# Patient Record
Sex: Female | Born: 1975 | Race: White | Hispanic: No | Marital: Single | Smoking: Former smoker
Health system: Southern US, Community
[De-identification: ages and names within clinical notes are randomized; demographics above are authoritative.]

## PROBLEM LIST (undated history)

## (undated) DIAGNOSIS — R8789 Other abnormal findings in specimens from female genital organs: Secondary | ICD-10-CM

## (undated) DIAGNOSIS — J329 Chronic sinusitis, unspecified: Secondary | ICD-10-CM

## (undated) DIAGNOSIS — J302 Other seasonal allergic rhinitis: Secondary | ICD-10-CM

## (undated) HISTORY — DX: Other abnormal findings in specimens from female genital organs: R87.89

## (undated) HISTORY — PX: OTHER SURGICAL HISTORY: SHX169

## (undated) HISTORY — PX: LEEP: SHX91

## (undated) HISTORY — DX: Other seasonal allergic rhinitis: J30.2

## (undated) HISTORY — PX: ABCESS DRAINAGE: SHX399

## (undated) HISTORY — DX: Chronic sinusitis, unspecified: J32.9

---

## 2015-03-03 ENCOUNTER — Ambulatory Visit (INDEPENDENT_AMBULATORY_CARE_PROVIDER_SITE_OTHER): Payer: Managed Care, Other (non HMO) | Admitting: Family Medicine

## 2015-03-03 ENCOUNTER — Encounter: Payer: Self-pay | Admitting: Family Medicine

## 2015-03-03 VITALS — BP 116/72 | HR 60 | Ht 61.5 in | Wt 124.0 lb

## 2015-03-03 DIAGNOSIS — Z800 Family history of malignant neoplasm of digestive organs: Secondary | ICD-10-CM

## 2015-03-03 DIAGNOSIS — Z23 Encounter for immunization: Secondary | ICD-10-CM

## 2015-03-03 DIAGNOSIS — Z0000 Encounter for general adult medical examination without abnormal findings: Secondary | ICD-10-CM | POA: Diagnosis not present

## 2015-03-03 LAB — POCT URINALYSIS DIPSTICK
Bilirubin, UA: NEGATIVE
Glucose, UA: NEGATIVE
Ketones, UA: NEGATIVE
Leukocytes, UA: NEGATIVE
NITRITE UA: NEGATIVE
PROTEIN UA: NEGATIVE
SPEC GRAV UA: 1.025
UROBILINOGEN UA: NEGATIVE
pH, UA: 6

## 2015-03-03 LAB — LIPID PANEL
CHOL/HDL RATIO: 2.4 ratio (ref <=5.0)
Cholesterol: 232 mg/dL — ABNORMAL HIGH (ref 125–200)
HDL: 95 mg/dL (ref >=46)
LDL Cholesterol: 128 mg/dL (ref <130)
Triglycerides: 44 mg/dL (ref <150)
VLDL: 9 mg/dL (ref <30)

## 2015-03-03 LAB — CBC WITH DIFFERENTIAL/PLATELET
BASOS ABS: 0 10*3/uL (ref 0.0–0.1)
BASOS PCT: 1 % (ref 0–1)
EOS ABS: 0 10*3/uL (ref 0.0–0.7)
EOS PCT: 1 % (ref 0–5)
HCT: 42.2 % (ref 36.0–46.0)
Hemoglobin: 14.3 g/dL (ref 12.0–15.0)
LYMPHS ABS: 0.8 10*3/uL (ref 0.7–4.0)
Lymphocytes Relative: 21 % (ref 12–46)
MCH: 33.4 pg (ref 26.0–34.0)
MCHC: 33.9 g/dL (ref 30.0–36.0)
MCV: 98.6 fL (ref 78.0–100.0)
MPV: 10.9 fL (ref 8.6–12.4)
Monocytes Absolute: 0.4 10*3/uL (ref 0.1–1.0)
Monocytes Relative: 9 % (ref 3–12)
NEUTROS PCT: 68 % (ref 43–77)
Neutro Abs: 2.7 10*3/uL (ref 1.7–7.7)
PLATELETS: 200 10*3/uL (ref 150–400)
RBC: 4.28 MIL/uL (ref 3.87–5.11)
RDW: 12.3 % (ref 11.5–15.5)
WBC: 4 10*3/uL (ref 4.0–10.5)

## 2015-03-03 LAB — COMPREHENSIVE METABOLIC PANEL
ALK PHOS: 40 U/L (ref 33–115)
ALT: 18 U/L (ref 6–29)
AST: 20 U/L (ref 10–30)
Albumin: 4.6 g/dL (ref 3.6–5.1)
BUN: 17 mg/dL (ref 7–25)
CHLORIDE: 102 mmol/L (ref 98–110)
CO2: 28 mmol/L (ref 20–31)
CREATININE: 0.74 mg/dL (ref 0.50–1.10)
Calcium: 9.3 mg/dL (ref 8.6–10.2)
GLUCOSE: 72 mg/dL (ref 65–99)
POTASSIUM: 4.4 mmol/L (ref 3.5–5.3)
SODIUM: 140 mmol/L (ref 135–146)
Total Bilirubin: 0.8 mg/dL (ref 0.2–1.2)
Total Protein: 6.9 g/dL (ref 6.1–8.1)

## 2015-03-03 LAB — TSH: TSH: 1.104 u[IU]/mL (ref 0.350–4.500)

## 2015-03-03 NOTE — Progress Notes (Signed)
Subjective:    Patient ID: Joan Hernandez, female    DOB: [birth year], 39 y.o.   MRN: [id number]  HPI She is new to the practice and here for a complete physical exam. She has no complaints. She moved here from [place] in 2015. She has not seen a health care provider in years and does not recall ever having labs done.  Has IUD for birth control. Has not had a menstrual cycle since getting her first Mirena in 2008 and she is happy with this.   She has 2 kids age 45 and 68, boy and girl. She is happily divorced. Recently had permament tattoo eyeliner.  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  Denies smoking but did smoke some in her early 60s. Drinks alcohol socially and denies drug use.  She does not have her immunization records and would like to get a Tdap and flu shot today.   Her mother has colon cancer and is in her 19s.   Reviewed allergies, medications, past medical, surgical, social history, and family history.   Review of Systems Review of Systems Constitutional: -fever, -chills, -sweats, -unexpected weight change,-fatigue ENT: -runny nose, -ear pain, -sore throat Cardiology:  -chest pain, -palpitations, -edema Respiratory: -cough, -shortness of breath, -wheezing Gastroenterology: -abdominal pain, -nausea, -vomiting, -diarrhea, -constipation  Hematology: -bleeding or bruising problems Musculoskeletal: -arthralgias, -myalgias, -joint swelling, -back pain Ophthalmology: -vision changes Genitourinary: -dysuria, -difficulty urinating, -hematuria, -urinary frequency, -urgency, -vaginal discharge, -vaginal dryness Neurology: -headache, -weakness, -tingling, -numbness       Objective:   Physical Exam BP 116/72 mmHg  Pulse 60  Ht 5' 1.5" (1.562 m)  Wt 124 lb (56.246 kg)  BMI 23.05 kg/m2  General Appearance:    Alert, cooperative, no distress, appears stated age  Head:    Normocephalic, without obvious abnormality, atraumatic  Eyes:    PERRL, conjunctiva/corneas clear,  EOM's intact, fundi    benign  Ears:    Normal TM's and external ear canals  Nose:   Nares normal, mucosa normal, no drainage or sinus   tenderness  Throat:   Lips, mucosa, and tongue normal; teeth and gums normal  Neck:   Supple, no lymphadenopathy;  thyroid:  no   enlargement/tenderness/nodules  Back:    Spine nontender, no curvature, ROM normal, no CVA     tenderness  Lungs:     Clear to auscultation bilaterally without wheezes, rales or     ronchi; respirations unlabored  Chest Wall:    No tenderness or deformity   Heart:    Regular rate and rhythm, S1 and S2 normal, no murmur, rub   or gallop  Breast Exam:    No tenderness, masses, or nipple discharge or inversion.      No axillary lymphadenopathy  Abdomen:     Soft, non-tender, nondistended, normoactive bowel sounds,    no masses, no hepatosplenomegaly  Genitalia:    Normal external genitalia without lesions.  BUS and vagina normal; cervix without lesions, or cervical motion tenderness. No abnormal vaginal discharge.  Uterus and adnexa not enlarged, nontender, no masses.  Pap was not performed  Rectal:    Not performed due to age<40 and no related complaints  Extremities:   No clubbing, cyanosis or edema  Pulses:   2+ and symmetric all extremities  Skin:   Skin color, texture, turgor normal, no rashes or lesions  Lymph nodes:   Cervical, supraclavicular, and axillary nodes normal  Neurologic:   CNII-XII intact, normal strength, sensation and  gait; reflexes 2+ and symmetric throughout          Psych:   Normal mood, affect, hygiene and grooming.    Urinalysis dipstick shows 1+blood.     Assessment & Plan:  Routine general medical examination at a health care facility - Plan: CBC with Differential/Platelet, Comprehensive metabolic panel, Lipid panel, POCT urinalysis dipstick, TSH  Family history of colon cancer in mother  Need for prophylactic vaccination and inoculation against influenza - Plan: Flu Vaccine QUAD 36+ mos IM  Need  for Tdap vaccination - Plan: Tdap vaccine greater than or equal to 7yo IM  Congratulated her on taking good care of herself by exercising daily and eating a well balanced diet consisting of mainly fruits and vegetables. Will bring her back for a repeat UA in 2 weeks since her dipstick UA was positive for blood. She will be due for a Pap smear and HPV co-testing next year. Recommend that she get her first colonoscopy in the next year or 2 due to mother having colon cancer in her 12s. Overall she is taking good care of herself.

## 2015-03-03 NOTE — Patient Instructions (Addendum)
Continue eating healthy and exercising. You will be due for your Pap smear and HPV testing next year. Today you received your flu shot and Tdap. We will call you with your lab results. I recommend getting a colonoscopy in your early 32s due to your family history.  Preventative Care for Adults - Female      MAINTAIN REGULAR HEALTH EXAMS:  A routine yearly physical is a good way to check in with your primary care provider about your health and preventive screening. It is also an opportunity to share updates about your health and any concerns you have, and receive a thorough all-over exam.   Most health insurance companies pay for at least some preventative services.  Check with your health plan for specific coverages.  WHAT PREVENTATIVE SERVICES DO WOMEN NEED?  Adult women should have their weight and blood pressure checked regularly.   Women age 62 and older should have their cholesterol levels checked regularly.  Women should be screened for cervical cancer with a Pap smear and pelvic exam beginning at either age 39, or 3 years after they become sexually activity.    Breast cancer screening generally begins at age 39 with a mammogram and breast exam by your primary care provider.    Beginning at age 39 and continuing to age 54, women should be screened for colorectal cancer.  Certain people may need continued testing until age 60.  Updating vaccinations is part of preventative care.  Vaccinations help protect against diseases such as the flu.  Osteoporosis is a disease in which the bones lose minerals and strength as we age. Women ages 39 and over should discuss this with their caregivers, as should women after menopause who have other risk factors.  Lab tests are generally done as part of preventative care to screen for anemia and blood disorders, to screen for problems with the kidneys and liver, to screen for bladder problems, to check blood sugar, and to check your cholesterol  level.  Preventative services generally include counseling about diet, exercise, avoiding tobacco, drugs, excessive alcohol consumption, and sexually transmitted infections.    GENERAL RECOMMENDATIONS FOR GOOD HEALTH:  Healthy diet:  Eat a variety of foods, including fruit, vegetables, animal or vegetable protein, such as meat, fish, chicken, and eggs, or beans, lentils, tofu, and grains, such as rice.  Drink plenty of water daily.  Decrease saturated fat in the diet, avoid lots of red meat, processed foods, sweets, fast foods, and fried foods.  Exercise:  Aerobic exercise helps maintain good heart health. At least 30-40 minutes of moderate-intensity exercise is recommended. For example, a brisk walk that increases your heart rate and breathing. This should be done on most days of the week.   Find a type of exercise or a variety of exercises that you enjoy so that it becomes a part of your daily life.  Examples are running, walking, swimming, water aerobics, and biking.  For motivation and support, explore group exercise such as aerobic class, spin class, Zumba, Yoga,or  martial arts, etc.    Set exercise goals for yourself, such as a certain weight goal, walk or run in a race such as a 5k walk/run.  Speak to your primary care provider about exercise goals.  Disease prevention:  If you smoke or chew tobacco, find out from your caregiver how to quit. It can literally save your life, no matter how long you have been a tobacco user. If you do not use tobacco, never begin.  Maintain a healthy diet and normal weight. Increased weight leads to problems with blood pressure and diabetes.   The Body Mass Index or BMI is a way of measuring how much of your body is fat. Having a BMI above 27 increases the risk of heart disease, diabetes, hypertension, stroke and other problems related to obesity. Your caregiver can help determine your BMI and based on it develop an exercise and dietary program to  help you achieve or maintain this important measurement at a healthful level.  High blood pressure causes heart and blood vessel problems.  Persistent high blood pressure should be treated with medicine if weight loss and exercise do not work.   Fat and cholesterol leaves deposits in your arteries that can block them. This causes heart disease and vessel disease elsewhere in your body.  If your cholesterol is found to be high, or if you have heart disease or certain other medical conditions, then you may need to have your cholesterol monitored frequently and be treated with medication.   Ask if you should have a cardiac stress test if your history suggests this. A stress test is a test done on a treadmill that looks for heart disease. This test can find disease prior to there being a problem.  Menopause can be associated with physical symptoms and risks. Hormone replacement therapy is available to decrease these. You should talk to your caregiver about whether starting or continuing to take hormones is right for you.   Osteoporosis is a disease in which the bones lose minerals and strength as we age. This can result in serious bone fractures. Risk of osteoporosis can be identified using a bone density scan. Women ages 23 and over should discuss this with their caregivers, as should women after menopause who have other risk factors. Ask your caregiver whether you should be taking a calcium supplement and Vitamin D, to reduce the rate of osteoporosis.   Avoid drinking alcohol in excess (more than two drinks per day).  Avoid use of street drugs. Do not share needles with anyone. Ask for professional help if you need assistance or instructions on stopping the use of alcohol, cigarettes, and/or drugs.  Brush your teeth twice a day with fluoride toothpaste, and floss once a day. Good oral hygiene prevents tooth decay and gum disease. The problems can be painful, unattractive, and can cause other health  problems. Visit your dentist for a routine oral and dental check up and preventive care every 6-12 months.   Look at your skin regularly.  Use a mirror to look at your back. Notify your caregivers of changes in moles, especially if there are changes in shapes, colors, a size larger than a pencil eraser, an irregular border, or development of new moles.  Safety:  Use seatbelts 100% of the time, whether driving or as a passenger.  Use safety devices such as hearing protection if you work in environments with loud noise or significant background noise.  Use safety glasses when doing any work that could send debris in to the eyes.  Use a helmet if you ride a bike or motorcycle.  Use appropriate safety gear for contact sports.  Talk to your caregiver about gun safety.  Use sunscreen with a SPF (or skin protection factor) of 15 or greater.  Lighter skinned people are at a greater risk of skin cancer. Don't forget to also wear sunglasses in order to protect your eyes from too much damaging sunlight. Damaging sunlight can accelerate cataract  formation.   Practice safe sex. Use condoms. Condoms are used for birth control and to help reduce the spread of sexually transmitted infections (or STIs).  Some of the STIs are gonorrhea (the clap), chlamydia, syphilis, trichomonas, herpes, HPV (human papilloma virus) and HIV (human immunodeficiency virus) which causes AIDS. The herpes, HIV and HPV are viral illnesses that have no cure. These can result in disability, cancer and death.   Keep carbon monoxide and smoke detectors in your home functioning at all times. Change the batteries every 6 months or use a model that plugs into the wall.   Vaccinations:  Stay up to date with your tetanus shots and other required immunizations. You should have a booster for tetanus every 10 years. Be sure to get your flu shot every year, since 5%-20% of the [place] population comes down with the flu. The flu vaccine changes each year,  so being vaccinated once is not enough. Get your shot in the fall, before the flu season peaks.   Other vaccines to consider:  Human Papilloma Virus or HPV causes cancer of the cervix, and other infections that can be transmitted from person to person. There is a vaccine for HPV, and females should get immunized between the ages of 49 and 54. It requires a series of 3 shots.   Pneumococcal vaccine to protect against certain types of pneumonia.  This is normally recommended for adults age 36 or older.  However, adults younger than 39 years old with certain underlying conditions such as diabetes, heart or lung disease should also receive the vaccine.  Shingles vaccine to protect against Varicella Zoster if you are older than age 79, or younger than 39 years old with certain underlying illness.  Hepatitis A vaccine to protect against a form of infection of the liver by a virus acquired from food.  Hepatitis B vaccine to protect against a form of infection of the liver by a virus acquired from blood or body fluids, particularly if you work in health care.  If you plan to travel internationally, check with your local health department for specific vaccination recommendations.  Cancer Screening:  Breast cancer screening is essential to preventive care for women. All women age 43 and older should perform a breast self-exam every month. At age 56 and older, women should have their caregiver complete a breast exam each year. Women at ages 53 and older should have a mammogram (x-ray film) of the breasts. Your caregiver can discuss how often you need mammograms.    Cervical cancer screening includes taking a Pap smear (sample of cells examined under a microscope) from the cervix (end of the uterus). It also includes testing for HPV (Human Papilloma Virus, which can cause cervical cancer). Screening and a pelvic exam should begin at age 62, or 3 years after a woman becomes sexually active. Screening should  occur every year, with a Pap smear but no HPV testing, up to age 33. After age 2, you should have a Pap smear every 3 years with HPV testing, if no HPV was found previously.   Most routine colon cancer screening begins at the age of 17. On a yearly basis, doctors may provide special easy to use take-home tests to check for hidden blood in the stool. Sigmoidoscopy or colonoscopy can detect the earliest forms of colon cancer and is life saving. These tests use a small camera at the end of a tube to directly examine the colon. Speak to your caregiver about this at  age 33, when routine screening begins (and is repeated every 5 years unless early forms of pre-cancerous polyps or small growths are found).

## 2015-03-21 ENCOUNTER — Other Ambulatory Visit (INDEPENDENT_AMBULATORY_CARE_PROVIDER_SITE_OTHER): Payer: Managed Care, Other (non HMO)

## 2015-03-21 DIAGNOSIS — R319 Hematuria, unspecified: Secondary | ICD-10-CM

## 2015-03-21 LAB — POCT URINALYSIS DIPSTICK
Bilirubin, UA: NEGATIVE
Glucose, UA: NEGATIVE
Ketones, UA: NEGATIVE
LEUKOCYTES UA: NEGATIVE
Nitrite, UA: NEGATIVE
PH UA: 5.5
UROBILINOGEN UA: NEGATIVE

## 2015-04-04 ENCOUNTER — Ambulatory Visit: Payer: Managed Care, Other (non HMO) | Admitting: Family Medicine

## 2015-05-24 ENCOUNTER — Encounter: Payer: Self-pay | Admitting: Family Medicine

## 2015-05-24 ENCOUNTER — Ambulatory Visit (INDEPENDENT_AMBULATORY_CARE_PROVIDER_SITE_OTHER): Payer: Managed Care, Other (non HMO) | Admitting: Family Medicine

## 2015-05-24 VITALS — BP 124/80 | HR 60 | Temp 98.1°F | Wt 124.2 lb

## 2015-05-24 DIAGNOSIS — J0100 Acute maxillary sinusitis, unspecified: Secondary | ICD-10-CM

## 2015-05-24 MED ORDER — AMOXICILLIN 875 MG PO TABS: 875.0000 mg | ORAL_TABLET | Freq: Two times a day (BID) | ORAL | Status: DC

## 2015-05-24 NOTE — Progress Notes (Signed)
Subjective:  Joan Hernandez is a 39 y.o. female who presents for possible sinus infection.  Symptoms include fatigue, sinus pressure, mild sore throat with some drainage and headache.  Denies fever, chills, cough, ear pain.  States she had a sinus infection in July and took antibiotics (augmentin) at that time. States she had GI upset from that particular antibiotic. She is traveling out of the country for a vacation in a few days.    She states she is taking care of her mother who has cancer. A lot of stress.  Past history is significant for no history of pneumonia or bronchitis. Patient is a non-smoker.  Using nothing for symptoms.  Denies sick contacts.  No other aggravating or relieving factors.  No other c/o.  ROS as in subjective   Objective: Filed Vitals:   05/24/15 1151  BP: 124/80  Pulse: 60  Temp: 98.1 F (36.7 C)    General appearance: Alert, WD/WN, no distress                             Skin: warm, no rash                           Head: + maxillary sinus tenderness, otherwise normal                            Eyes: conjunctiva normal, corneas clear, PERRLA                            Ears: pearly TMs, external ear canals normal                          Nose: septum midline, turbinates swollen, with erythema and clear discharge              Mouth/throat: MMM, tongue normal, mild pharyngeal erythema                           Neck: supple, no adenopathy, no thyromegaly, nontender                          Heart: RRR, normal S1, S2, no murmurs                         Lungs: CTA bilaterally, no wheezes, rales, or rhonchi      Assessment and Plan:   Prescription given for Amoxicillin with instructions to do symptomatic treatment and watchful waiting for the next 2-3 days and then she can start the antibiotic if no improvement since she is going out of the country. Discussed that if she gets worse in the next couple of days she can go and start the antibiotic as well.  Can use  OTC Mucinex or Sudafed for congestion.  Tylenol or Ibuprofen OTC for fever and malaise.  Discussed symptomatic relief, nasal saline flush, and call or return if worse after completing the antibiotic.

## 2015-05-24 NOTE — Patient Instructions (Signed)
Treat your symptoms for the next 2-3 days and if you get worse start the antibiotic. If no improvement in 3 days you can start the antibiotic. Stay well hydrated. Use saline nasal spray twice daily. If you take the antibiotic, eat yogurt daily or take a probiotic and let me know if you're not back to normal after completing it.  Sinusitis, Adult Sinusitis is redness, soreness, and inflammation of the paranasal sinuses. Paranasal sinuses are air pockets within the bones of your face. They are located beneath your eyes, in the middle of your forehead, and above your eyes. In healthy paranasal sinuses, mucus is able to drain out, and air is able to circulate through them by way of your nose. However, when your paranasal sinuses are inflamed, mucus and air can become trapped. This can allow bacteria and other germs to grow and cause infection. Sinusitis can develop quickly and last only a short time (acute) or continue over a long period (chronic). Sinusitis that lasts for more than 12 weeks is considered chronic. CAUSES Causes of sinusitis include:  Allergies.  Structural abnormalities, such as displacement of the cartilage that separates your nostrils (deviated septum), which can decrease the air flow through your nose and sinuses and affect sinus drainage.  Functional abnormalities, such as when the small hairs (cilia) that line your sinuses and help remove mucus do not work properly or are not present. SIGNS AND SYMPTOMS Symptoms of acute and chronic sinusitis are the same. The primary symptoms are pain and pressure around the affected sinuses. Other symptoms include:  Upper toothache.  Earache.  Headache.  Bad breath.  Decreased sense of smell and taste.  A cough, which worsens when you are lying flat.  Fatigue.  Fever.  Thick drainage from your nose, which often is green and may contain pus (purulent).  Swelling and warmth over the affected sinuses. DIAGNOSIS Your health care  provider will perform a physical exam. During your exam, your health care provider may perform any of the following to help determine if you have acute sinusitis or chronic sinusitis:  Look in your nose for signs of abnormal growths in your nostrils (nasal polyps).  Tap over the affected sinus to check for signs of infection.  View the inside of your sinuses using an imaging device that has a light attached (endoscope). If your health care provider suspects that you have chronic sinusitis, one or more of the following tests may be recommended:  Allergy tests.  Nasal culture. A sample of mucus is taken from your nose, sent to a lab, and screened for bacteria.  Nasal cytology. A sample of mucus is taken from your nose and examined by your health care provider to determine if your sinusitis is related to an allergy. TREATMENT Most cases of acute sinusitis are related to a viral infection and will resolve on their own within 10 days. Sometimes, medicines are prescribed to help relieve symptoms of both acute and chronic sinusitis. These may include pain medicines, decongestants, nasal steroid sprays, or saline sprays. However, for sinusitis related to a bacterial infection, your health care provider will prescribe antibiotic medicines. These are medicines that will help kill the bacteria causing the infection. Rarely, sinusitis is caused by a fungal infection. In these cases, your health care provider will prescribe antifungal medicine. For some cases of chronic sinusitis, surgery is needed. Generally, these are cases in which sinusitis recurs more than 3 times per year, despite other treatments. HOME CARE INSTRUCTIONS  Drink plenty of  water. Water helps thin the mucus so your sinuses can drain more easily.  Use a humidifier.  Inhale steam 3-4 times a day (for example, sit in the bathroom with the shower running).  Apply a warm, moist washcloth to your face 3-4 times a day, or as directed by  your health care provider.  Use saline nasal sprays to help moisten and clean your sinuses.  Take medicines only as directed by your health care provider.  If you were prescribed either an antibiotic or antifungal medicine, finish it all even if you start to feel better. SEEK IMMEDIATE MEDICAL CARE IF:  You have increasing pain or severe headaches.  You have nausea, vomiting, or drowsiness.  You have swelling around your face.  You have vision problems.  You have a stiff neck.  You have difficulty breathing.   This information is not intended to replace advice given to you by your health care provider. Make sure you discuss any questions you have with your health care provider.   Document Released: 05/28/2005 Document Revised: 06/18/2014 Document Reviewed: 06/12/2011 [employer] Interactive Patient Education [employer]2016 [employer]

## 2015-05-31 ENCOUNTER — Encounter: Payer: Self-pay | Admitting: Family Medicine

## 2015-06-12 DIAGNOSIS — R87618 Other abnormal cytological findings on specimens from cervix uteri: Secondary | ICD-10-CM

## 2015-06-12 HISTORY — DX: Other abnormal cytological findings on specimens from cervix uteri: R87.618

## 2015-10-13 ENCOUNTER — Encounter: Payer: Self-pay | Admitting: Family Medicine

## 2015-10-13 ENCOUNTER — Ambulatory Visit (INDEPENDENT_AMBULATORY_CARE_PROVIDER_SITE_OTHER): Payer: Managed Care, Other (non HMO) | Admitting: Family Medicine

## 2015-10-13 VITALS — BP 120/70 | HR 60 | Temp 98.2°F | Wt 120.2 lb

## 2015-10-13 DIAGNOSIS — J0100 Acute maxillary sinusitis, unspecified: Secondary | ICD-10-CM | POA: Diagnosis not present

## 2015-10-13 MED ORDER — AMOXICILLIN 875 MG PO TABS: 875.0000 mg | ORAL_TABLET | Freq: Two times a day (BID) | ORAL | Status: DC

## 2015-10-13 NOTE — Patient Instructions (Signed)
Make sure you are staying well hydrated. You can take Tylenol or ibuprofen for pain and continue using a neti pot as needed. If you suspect allergies are contributing to your symptoms then I recommend taking Claritin. You can also use Sudafed for congestion. If you are not back to your baseline after completing the antibiotic let me know.   Sinusitis, Adult Sinusitis is redness, soreness, and inflammation of the paranasal sinuses. Paranasal sinuses are air pockets within the bones of your face. They are located beneath your eyes, in the middle of your forehead, and above your eyes. In healthy paranasal sinuses, mucus is able to drain out, and air is able to circulate through them by way of your nose. However, when your paranasal sinuses are inflamed, mucus and air can become trapped. This can allow bacteria and other germs to grow and cause infection. Sinusitis can develop quickly and last only a short time (acute) or continue over a long period (chronic). Sinusitis that lasts for more than 12 weeks is considered chronic. CAUSES Causes of sinusitis include:  Allergies.  Structural abnormalities, such as displacement of the cartilage that separates your nostrils (deviated septum), which can decrease the air flow through your nose and sinuses and affect sinus drainage.  Functional abnormalities, such as when the small hairs (cilia) that line your sinuses and help remove mucus do not work properly or are not present. SIGNS AND SYMPTOMS Symptoms of acute and chronic sinusitis are the same. The primary symptoms are pain and pressure around the affected sinuses. Other symptoms include:  Upper toothache.  Earache.  Headache.  Bad breath.  Decreased sense of smell and taste.  A cough, which worsens when you are lying flat.  Fatigue.  Fever.  Thick drainage from your nose, which often is green and may contain pus (purulent).  Swelling and warmth over the affected sinuses. DIAGNOSIS Your  health care provider will perform a physical exam. During your exam, your health care provider may perform any of the following to help determine if you have acute sinusitis or chronic sinusitis:  Look in your nose for signs of abnormal growths in your nostrils (nasal polyps).  Tap over the affected sinus to check for signs of infection.  View the inside of your sinuses using an imaging device that has a light attached (endoscope). If your health care provider suspects that you have chronic sinusitis, one or more of the following tests may be recommended:  Allergy tests.  Nasal culture. A sample of mucus is taken from your nose, sent to a lab, and screened for bacteria.  Nasal cytology. A sample of mucus is taken from your nose and examined by your health care provider to determine if your sinusitis is related to an allergy. TREATMENT Most cases of acute sinusitis are related to a viral infection and will resolve on their own within 10 days. Sometimes, medicines are prescribed to help relieve symptoms of both acute and chronic sinusitis. These may include pain medicines, decongestants, nasal steroid sprays, or saline sprays. However, for sinusitis related to a bacterial infection, your health care provider will prescribe antibiotic medicines. These are medicines that will help kill the bacteria causing the infection. Rarely, sinusitis is caused by a fungal infection. In these cases, your health care provider will prescribe antifungal medicine. For some cases of chronic sinusitis, surgery is needed. Generally, these are cases in which sinusitis recurs more than 3 times per year, despite other treatments. HOME CARE INSTRUCTIONS  Drink plenty of water.  Water helps thin the mucus so your sinuses can drain more easily.  Use a humidifier.  Inhale steam 3-4 times a day (for example, sit in the bathroom with the shower running).  Apply a warm, moist washcloth to your face 3-4 times a day, or as  directed by your health care provider.  Use saline nasal sprays to help moisten and clean your sinuses.  Take medicines only as directed by your health care provider.  If you were prescribed either an antibiotic or antifungal medicine, finish it all even if you start to feel better. SEEK IMMEDIATE MEDICAL CARE IF:  You have increasing pain or severe headaches.  You have nausea, vomiting, or drowsiness.  You have swelling around your face.  You have vision problems.  You have a stiff neck.  You have difficulty breathing.   This information is not intended to replace advice given to you by your health care provider. Make sure you discuss any questions you have with your health care provider.   Document Released: 05/28/2005 Document Revised: 06/18/2014 Document Reviewed: 06/12/2011 Elsevier Interactive Patient Education [employer].

## 2015-10-13 NOTE — Progress Notes (Signed)
Subjective:  Joan Hernandez is a 40 y.o. female who presents for possible sinus infection.  Symptoms include a 2 week history of feeling tired and maxillary sinus pressure, and mild sore throat. States she usually has a sinus infection when she feels this way.   Denies fever, chills, ear pain, cough, nausea, vomiting.  Denies history of allergies. No recent antibiotics. She states she has not been staying well hydrated  Past history is significant for no history of pneumonia or bronchitis. Patient is a former smoker, quit 20 or so years ago.  Using neti pot and sudafed and claritin for symptoms.  Denies sick contacts.  No other aggravating or relieving factors.   States she felt like she was getting a yeast infection 2 or 3 days ago but symptoms have mostly resolved. She denies vaginal discharge, itching but states she just doesn't feel her norm. She does not want medication for this but wanted to make me aware in case it gets worse.   LMP: 2007. Birth control IUD.   ROS as in subjective   Objective: Filed Vitals:   10/13/15 1327  BP: 120/70  Pulse: 60  Temp: 98.2 F (36.8 C)    General appearance: Alert, WD/WN, no distress                             Skin: warm, no rash                           Head: + maxillary sinus tenderness L>R,                            Eyes: conjunctiva normal, corneas clear, PERRLA                            Ears: pearly TMs, external ear canals normal                          Nose: septum midline, turbinates swollen, with erythema and clear discharge             Mouth/throat: MMM, tongue normal, mild pharyngeal erythema                           Neck: supple, no adenopathy, no thyromegaly, nontender                          Heart: RRR, normal S1, S2, no murmurs                         Lungs: CTA bilaterally, no wheezes, rales, or rhonchi      Assessment and Plan: Acute maxillary sinusitis, recurrence not specified - Plan: amoxicillin (AMOXIL) 875 MG  tablet  Prescription given for Amoxicilin.  Can use OTC Mucinex or Sudafed for congestion.  Tylenol or Ibuprofen OTC for fever and malaise.  Discussed symptomatic relief, nasal saline flush or neti pot.  She will call if she develops yeast infection since she has a history of antibiotics causing this. She will let me know if she is not back to baseline after completing the antibiotic. If she suspects allergies contributing to her symptoms then she will take Claritin.

## 2015-11-02 ENCOUNTER — Encounter: Payer: Self-pay | Admitting: Family Medicine

## 2015-12-06 ENCOUNTER — Encounter: Payer: Self-pay | Admitting: Internal Medicine

## 2015-12-06 ENCOUNTER — Ambulatory Visit (INDEPENDENT_AMBULATORY_CARE_PROVIDER_SITE_OTHER): Payer: Managed Care, Other (non HMO) | Admitting: Family Medicine

## 2015-12-06 ENCOUNTER — Encounter: Payer: Self-pay | Admitting: Family Medicine

## 2015-12-06 VITALS — BP 118/78 | HR 68 | Temp 98.1°F | Wt 122.4 lb

## 2015-12-06 DIAGNOSIS — J329 Chronic sinusitis, unspecified: Secondary | ICD-10-CM

## 2015-12-06 MED ORDER — LEVOFLOXACIN 500 MG PO TABS: 500.0000 mg | ORAL_TABLET | Freq: Every day | ORAL | Status: DC

## 2015-12-06 MED ORDER — PREDNISONE 10 MG (21) PO TBPK: 10.0000 mg | ORAL_TABLET | Freq: Every day | ORAL | Status: DC

## 2015-12-06 NOTE — Progress Notes (Signed)
Subjective:  Joan Hernandez is a 40 y.o. female who presents for possible sinus infection.  Symptoms include maxillary sinus pressure, dizziness, blowing clear thick drainage, mild sore throat, occasional productive cough and pain behind her eyes.  Denies fever, chills, ear pain , chest pain, shortness of breath, nausea, vomiting, diarrhea. She was seen and diagnosed with acute sinusitis and early May and treated with amoxicillin at that time. States she was somewhat improved however did not return due to dealing with the death of her mother. States she went [facility] approximately 2 weeks ago and was diagnosed with acute sinusitis and finished Augmentin 2 days ago. Reports feeling approximately 10% better after completing the Augmentin. States she felt like she was having increased drainage but today reports feeling just as congested as before the antibiotic.Marland Kitchen.  Sinus infections- recurrent since 2013. Since moving back from [place]. Denies history of sinus surgery. Has been to allergist in the past 2 years and reports allergy to dust mites only. Has been using Flonase daily and neti pot bid for the past 2 weeks with minimal relief.  Former smoker and quit while she was in college.  IUD for birth control.   Past history is significant for no history of pneumonia or bronchitis. Patient is a former smoker, quit approximately 20 years ago.  Using flonase, neti pot, singulair, multi vitamins for symptoms.  Denies sick contacts.  No other aggravating or relieving factors.   Mother just passed from colon cancer at age 40.   Reviewed allergies, medications, past medical, surgical and social history.  ROS as in subjective   Objective: Filed Vitals:   12/06/15 1328  BP: 118/78  Pulse: 68  Temp: 98.1 F (36.7 C)    General appearance: Alert, WD/WN, no distress                             Skin: warm, no rash                           Head: + maxillary sinus tenderness, Mild frontal and  ethmoid tenderness                            Eyes: conjunctiva normal, corneas clear, PERRLA                            Ears: pearly TMs, external ear canals normal                          Nose: septum midline, turbinates swollen, with erythema and clear discharge             Mouth/throat: MMM, tongue normal, mild pharyngeal erythema Without edema or exudate                           Neck: supple, no adenopathy, no thyromegaly, nontender                          Heart: RRR, normal S1, S2, no murmurs                         Lungs: CTA bilaterally, no wheezes, rales, or rhonchi  Assessment and Plan: Recurrent sinusitis - Plan: levofloxacin (LEVAQUIN) 500 MG tablet, predniSONE (STERAPRED UNI-PAK 21 TAB) 10 MG (21) TBPK tablet, CT MAXILLOFACIAL WO CONTRAST  Prescription given for steroid Dosepak and antibiotic. Discussed risks versus benefits of taking steroid and antibiotic. Plan to order CT of sinus to look for underlying etiology.  Can use OTC Mucinex for congestion.  Tylenol or Ibuprofen OTC for fever and malaise.  Discussed symptomatic relief, may continue using neti-pot. Recommend she stay well hydrated . Will consider referral to ENT is she is not back to baseline at follow-up in 2 weeks.

## 2015-12-12 ENCOUNTER — Other Ambulatory Visit: Payer: Self-pay

## 2016-01-10 ENCOUNTER — Encounter: Payer: Self-pay | Admitting: Family Medicine

## 2016-01-10 ENCOUNTER — Ambulatory Visit (INDEPENDENT_AMBULATORY_CARE_PROVIDER_SITE_OTHER): Payer: Managed Care, Other (non HMO) | Admitting: Family Medicine

## 2016-01-10 VITALS — BP 110/64 | HR 64 | Wt 124.6 lb

## 2016-01-10 DIAGNOSIS — N898 Other specified noninflammatory disorders of vagina: Secondary | ICD-10-CM

## 2016-01-10 DIAGNOSIS — B373 Candidiasis of vulva and vagina: Secondary | ICD-10-CM

## 2016-01-10 DIAGNOSIS — B3731 Acute candidiasis of vulva and vagina: Secondary | ICD-10-CM

## 2016-01-10 LAB — POCT WET PREP (WET MOUNT)
Clue Cells Wet Prep Whiff POC: NEGATIVE
KOH WET PREP POC: NEGATIVE
Trichomonas Wet Prep HPF POC: NEGATIVE

## 2016-01-10 MED ORDER — FLUCONAZOLE 150 MG PO TABS: 150.0000 mg | ORAL_TABLET | Freq: Once | ORAL | 0 refills | Status: AC

## 2016-01-10 NOTE — Patient Instructions (Signed)

## 2016-01-10 NOTE — Progress Notes (Signed)
   Subjective:    Patient ID: Joan Hernandez, female    DOB: 1976/01/18, 40 y.o.   MRN: [id number]  HPI Chief Complaint  Patient presents with  . Other    yeast infection since friday   She is here with complaints of a 3 day history vaginal discharge that is thick, white and "lumpy". Also reports some mild Itching. No odor.  Recent swimming pool use and sitting in wet bathing suit. Thinks she has a yeast infection.  New sexual partner in past week.  Denies history of STI.  Denies urinary symptoms  LMP: 2007, 2 pregnancies and IUD.    Denies fever, chills, nausea, vomiting, abdominal pain.    Review of Systems Pertinent positives and negatives in the history of present illness.     Objective:   Physical Exam  Constitutional: She appears well-developed and well-nourished. No distress.  Genitourinary: There is no rash or tenderness on the right labia. There is no rash or tenderness on the left labia. No tenderness or bleeding in the vagina. Vaginal discharge found.  Genitourinary Comments: White, thick clumpy discharge to vaginal canal and cervix.    BP 110/64   Pulse 64   Wt 124 lb 9.6 oz (56.5 kg)   BMI 23.16 kg/m       Assessment & Plan:  Vaginal discharge  Candidiasis of vagina - Plan: fluconazole (DIFLUCAN) 150 MG tablet  Discussed that she has a yeast infection per wet mount and will treat with Diflucan. Also discussed that she had a few clue cells but not enough to diagnose with BV. She will take the Diflucan and after 3 days if she is not back to baseline then she will give [place] a call. Will then consider treating for BV. Will also send GC chlamydia swab due to recent new sexual partner.

## 2016-01-11 LAB — GC/CHLAMYDIA PROBE AMP
CT PROBE, AMP APTIMA: NOT DETECTED
GC PROBE AMP APTIMA: NOT DETECTED

## 2016-02-14 ENCOUNTER — Encounter: Payer: Self-pay | Admitting: Family Medicine

## 2016-02-14 ENCOUNTER — Ambulatory Visit (INDEPENDENT_AMBULATORY_CARE_PROVIDER_SITE_OTHER): Payer: Managed Care, Other (non HMO) | Admitting: Family Medicine

## 2016-02-14 VITALS — BP 108/68 | HR 60 | Temp 98.3°F | Wt 124.2 lb

## 2016-02-14 DIAGNOSIS — J029 Acute pharyngitis, unspecified: Secondary | ICD-10-CM | POA: Diagnosis not present

## 2016-02-14 LAB — POCT RAPID STREP A (OFFICE): RAPID STREP A SCREEN: NEGATIVE

## 2016-02-14 NOTE — Progress Notes (Signed)
Subjective:  Joan Hernandez is a 40 y.o. female who presents for evaluation of sore throat.  She has not had a recent close exposure to someone with proven streptococcal pharyngitis.  Associated symptoms include low grade fever, chills, headache, painful swallowing, hoarseness.  Denies ear pain, rhinorrhea, chest pain, shortness of breath, cough.   Treatment to date: cough suppressants.  ? sick contacts.  No other aggravating or relieving factors.  No other c/o.  The following portions of the patient's history were reviewed and updated as appropriate: allergies, current medications, past medical history, past social history, past surgical history and problem list.  ROS as in  Past Medical History:  Diagnosis Date  . Seasonal allergies       Objective: Vitals:   02/14/16 1152  BP: 108/68  Pulse: 60  Temp: 98.3 F (36.8 C)    General appearance: no distress, WD/WN, mildly ill-appearing HEENT: normocephalic, conjunctiva/corneas normal, sclerae anicteric, nares patent, no discharge or erythema, pharynx with erythema, without exudate.  Oral cavity: MMM, no lesions  Neck: supple, no lymphadenopathy, no thyromegaly Heart: RRR, normal S1, S2, no murmurs Lungs: CTA bilaterally, no wheezes, rhonchi, or rales    Laboratory Strep test not done. Results:negative.    Assessment and Plan: Acute pharyngitis, unspecified etiology - Plan: POCT rapid strep A  Advised that symptoms and exam suggest a viral etiology.  Discussed symptomatic treatment including salt water gargles, warm fluids, rest, hydrate well, can use over-the-counter Tylenol or Ibuprofen for throat pain, fever, or malaise. If worse or not improving within 2-3 days, call or return.

## 2016-02-16 ENCOUNTER — Telehealth: Payer: Self-pay | Admitting: Family Medicine

## 2016-02-16 MED ORDER — AMOXICILLIN 875 MG PO TABS: 875.0000 mg | ORAL_TABLET | Freq: Two times a day (BID) | ORAL | 0 refills | Status: DC

## 2016-02-16 MED ORDER — MAGIC MOUTHWASH W/LIDOCAINE: 5.0000 mL | Freq: Three times a day (TID) | ORAL | 0 refills | Status: DC

## 2016-02-16 NOTE — Telephone Encounter (Signed)
Please call in amoxicillin 875mg  bid for 10 days and no refills and magic mouthwash for her. Let her know we are sending both of these.

## 2016-02-16 NOTE — Telephone Encounter (Signed)
Pt called she is still sick, running fever, can't swallow.  Please call antibiotic to [facility] on [address]

## 2016-02-16 NOTE — Telephone Encounter (Signed)
Sent in antibiotic and called in magic mouthwash into pharmacy

## 2016-03-07 ENCOUNTER — Encounter: Payer: Self-pay | Admitting: Family Medicine

## 2016-03-07 ENCOUNTER — Ambulatory Visit (INDEPENDENT_AMBULATORY_CARE_PROVIDER_SITE_OTHER): Payer: Managed Care, Other (non HMO) | Admitting: Family Medicine

## 2016-03-07 ENCOUNTER — Encounter: Payer: Self-pay | Admitting: Gastroenterology

## 2016-03-07 ENCOUNTER — Other Ambulatory Visit (HOSPITAL_COMMUNITY)
Admission: RE | Admit: 2016-03-07 | Discharge: 2016-03-07 | Disposition: A | Discharge status: Home / Self Care | Payer: Managed Care, Other (non HMO) | Source: Ambulatory Visit | Attending: Family Medicine | Admitting: Family Medicine

## 2016-03-07 VITALS — BP 120/80 | HR 61 | Wt 126.6 lb

## 2016-03-07 DIAGNOSIS — R10815 Periumbilic abdominal tenderness: Secondary | ICD-10-CM

## 2016-03-07 DIAGNOSIS — Z01411 Encounter for gynecological examination (general) (routine) with abnormal findings: Secondary | ICD-10-CM | POA: Insufficient documentation

## 2016-03-07 DIAGNOSIS — Z23 Encounter for immunization: Secondary | ICD-10-CM | POA: Diagnosis not present

## 2016-03-07 DIAGNOSIS — Z1151 Encounter for screening for human papillomavirus (HPV): Secondary | ICD-10-CM | POA: Diagnosis not present

## 2016-03-07 DIAGNOSIS — Z1211 Encounter for screening for malignant neoplasm of colon: Secondary | ICD-10-CM

## 2016-03-07 DIAGNOSIS — Z0000 Encounter for general adult medical examination without abnormal findings: Secondary | ICD-10-CM | POA: Diagnosis not present

## 2016-03-07 DIAGNOSIS — Z800 Family history of malignant neoplasm of digestive organs: Secondary | ICD-10-CM

## 2016-03-07 DIAGNOSIS — Z1239 Encounter for other screening for malignant neoplasm of breast: Secondary | ICD-10-CM

## 2016-03-07 DIAGNOSIS — R108A3 Suprapubic tenderness: Secondary | ICD-10-CM

## 2016-03-07 DIAGNOSIS — Z124 Encounter for screening for malignant neoplasm of cervix: Secondary | ICD-10-CM

## 2016-03-07 DIAGNOSIS — R10819 Abdominal tenderness, unspecified site: Secondary | ICD-10-CM

## 2016-03-07 DIAGNOSIS — Z113 Encounter for screening for infections with a predominantly sexual mode of transmission: Secondary | ICD-10-CM

## 2016-03-07 DIAGNOSIS — Z13220 Encounter for screening for lipoid disorders: Secondary | ICD-10-CM

## 2016-03-07 LAB — POCT URINALYSIS DIPSTICK
Bilirubin, UA: NEGATIVE
Glucose, UA: NEGATIVE
KETONES UA: NEGATIVE
Leukocytes, UA: NEGATIVE
Nitrite, UA: NEGATIVE
PROTEIN UA: NEGATIVE
RBC UA: NEGATIVE
SPEC GRAV UA: 1.015
Urobilinogen, UA: NEGATIVE
pH, UA: 8

## 2016-03-07 NOTE — Progress Notes (Signed)
Subjective:    Patient ID: Joan Hernandez, female    DOB: 04-27-1976, 40 y.o.   MRN: [id number]  HPI Chief Complaint  Patient presents with  . cpe    fasitng cpe with pap- felt lump on left side   She is here for a complete physical exam. Has concerns about a lump she felt in her lower abdominal area last week and reports having intermittent lower abdominal pain. She cannot find the lump today.  Nothing aggravates or alleviates her pain. Reports having loose stool after eating and this is ongoing for years. No changes to bowel habits. Denies blood in stool.  Denies fever, chills, back pain, nausea, vomiting, urinary symptoms, vaginal discharge, dyspareunia.  Last CPE: 02/2015 Other providers: [facility]   Past medical history: chronic sinusitis Surgeries: tonsil abscess   Family history: mother had colon cancer in her 23s and passed away at age 45  Social history: Lives with her 2 children ages 62 and 1. 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  Diet: eat fresh foods and  Excerise: daily   Immunizations: up to date on Tdap. Flu shot.   Health maintenance:  Mammogram: never  Colonoscopy: never Last Gynecological Exam: pap smear is due.  Last Menstrual cycle: Has IUD for birth control- November 2014 due in 2019.  Has not had a menstrual cycle since getting her first Mirena in 2008 and she is happy with this.  STD testing - would like HIV and syphilis test   Last Dental Exam: twice annually.  Last Eye Exam: wears contact lenses, in past year.  Wears seatbelt always, uses sunscreen, smoke detectors in home and functioning, does not text while driving and feels safe in home environment.   Reviewed allergies, medications, past medical, surgical, family, and social history.   Review of Systems Review of Systems Constitutional: -fever, -chills, -sweats, -unexpected weight change,-fatigue ENT: -runny nose, -ear pain, -sore throat Cardiology:   -chest pain, -palpitations, -edema Respiratory: -cough, -shortness of breath, -wheezing Gastroenterology: + lower abdominal pain, -nausea, -vomiting, -diarrhea, -constipation  Hematology: -bleeding or bruising problems Musculoskeletal: -arthralgias, -myalgias, -joint swelling, -back pain Ophthalmology: -vision changes Urology: -dysuria, -difficulty urinating, -hematuria, -urinary frequency, -urgency Neurology: -headache, -weakness, -tingling, -numbness       Objective:   Physical Exam BP 120/80   Pulse 61   Wt 126 lb 9.6 oz (57.4 kg)   BMI 23.53 kg/m   General Appearance:    Alert, cooperative, no distress, appears stated age  Head:    Normocephalic, without obvious abnormality, atraumatic  Eyes:    PERRL, conjunctiva/corneas clear, EOM's intact, fundi    benign  Ears:    Normal TM's and external ear canals  Nose:   Nares normal, mucosa normal, no drainage or sinus   tenderness  Throat:   Lips, mucosa, and tongue normal; teeth and gums normal  Neck:   Supple, no lymphadenopathy;  thyroid:  no   enlargement/tenderness/nodules; no carotid   bruit or JVD  Back:    Spine nontender, no curvature, ROM normal, no CVA     tenderness  Lungs:     Clear to auscultation bilaterally without wheezes, rales or     ronchi; respirations unlabored  Chest Wall:    No tenderness or deformity   Heart:    Regular rate and rhythm, S1 and S2 normal, no murmur, rub   or gallop  Breast Exam:    No tenderness, masses, or nipple discharge or inversion.  No axillary lymphadenopathy  Abdomen:     Soft, tenderness to suprapubic area otherwise non-tender, nondistended, normoactive bowel sounds, no rebound, guarding or referred pain,    no masses, no hepatosplenomegaly  Genitalia:    Normal external genitalia without lesions.  BUS and vagina normal; cervix without lesions, or cervical motion tenderness. No abnormal vaginal discharge.  Uterus and adnexa not enlarged, nontender, no masses.  Pap performed.  Chaperone present.   Rectal:   GI referral for screening colonoscopy  Extremities:   No clubbing, cyanosis or edema  Pulses:   2+ and symmetric all extremities  Skin:   Skin color, texture, turgor normal, no rashes or lesions  Lymph nodes:   Cervical, supraclavicular, and axillary nodes normal  Neurologic:   CNII-XII intact, normal strength, sensation and gait; reflexes 2+ and symmetric throughout          Psych:   Normal mood, affect, hygiene and grooming.    Urinalysis dipstick: negative      Assessment & Plan:  Routine general medical examination at a health care facility - Plan: CBC with Differential/Platelet, Comprehensive metabolic panel, TSH, Lipid panel, POCT urinalysis dipstick  Screening for breast cancer - Plan: MM DIGITAL SCREENING BILATERAL  Screening for lipid disorders - Plan: Lipid panel  Screening for cervical cancer - Plan: Cytology - PAP  Special screening for malignant neoplasms, colon - Plan: Ambulatory referral to Gastroenterology  Family history of colon cancer in mother - Plan: Ambulatory referral to Gastroenterology  Screening for STD (sexually transmitted disease) - Plan: HIV antibody, RPR, GC/Chlamydia Probe Amp, HIV antibody (with reflex)  Need for prophylactic vaccination and inoculation against influenza - Plan: Flu Vaccine QUAD 36+ mos IM  Periumbilical abdominal tenderness without rebound tenderness - Plan: US Abdomen Complete, US Pelvis Complete, US Transvaginal Non-OB  Suprapubic tenderness - Plan: US Abdomen Complete, US Pelvis Complete, US Transvaginal Non-OB, GC/Chlamydia Probe Amp  Overall she appears to be generally healthy and is taking good care of herself.  Suprapubic tenderness and lower abdominal pain- Plan to check labs and GC/CT. Abdominal/pelvic US ordered for 03/20/2016.  Pap smear performed and chaperone present.  Discussed that I am sending her to GI for screening colonoscopy due to mother with colon cancer in her 8050s.  Mammogram  ordered.  Up to date on immunizations.  Flu shot given.  Discussed safety and health promotion.  She will return for fasting labs. Orders are in the computer.  Follow up pending labs and US.

## 2016-03-07 NOTE — Patient Instructions (Addendum)
Call and schedule your mammogram.  GI office will call you to schedule a visit to discuss colonoscopy.  Return for fasting labs.  Your urine was normal today.  Flu shot received today.   Preventative Care for Adults - Female      MAINTAIN REGULAR HEALTH EXAMS:  A routine yearly physical is a good way to check in with your primary care provider about your health and preventive screening. It is also an opportunity to share updates about your health and any concerns you have, and receive a thorough all-over exam.   Most health insurance companies pay for at least some preventative services.  Check with your health plan for specific coverages.  WHAT PREVENTATIVE SERVICES DO WOMEN NEED?  Adult women should have their weight and blood pressure checked regularly.   Women age 40 and older should have their cholesterol levels checked regularly.  Women should be screened for cervical cancer with a Pap smear and pelvic exam beginning at either age 40, or 3 years after they become sexually activity.    Breast cancer screening generally begins at age 40 with a mammogram and breast exam by your primary care provider.    Beginning at age 40 and continuing to age 40, women should be screened for colorectal cancer.  Certain people may need continued testing until age 985.  Updating vaccinations is part of preventative care.  Vaccinations help protect against diseases such as the flu.  Osteoporosis is a disease in which the bones lose minerals and strength as we age. Women ages 765 and over should discuss this with their caregivers, as should women after menopause who have other risk factors.  Lab tests are generally done as part of preventative care to screen for anemia and blood disorders, to screen for problems with the kidneys and liver, to screen for bladder problems, to check blood sugar, and to check your cholesterol level.  Preventative services generally include counseling about diet, exercise,  avoiding tobacco, drugs, excessive alcohol consumption, and sexually transmitted infections.    GENERAL RECOMMENDATIONS FOR GOOD HEALTH:  Healthy diet:  Eat a variety of foods, including fruit, vegetables, animal or vegetable protein, such as meat, fish, chicken, and eggs, or beans, lentils, tofu, and grains, such as rice.  Drink plenty of water daily.  Decrease saturated fat in the diet, avoid lots of red meat, processed foods, sweets, fast foods, and fried foods.  Exercise:  Aerobic exercise helps maintain good heart health. At least 30-40 minutes of moderate-intensity exercise is recommended. For example, a brisk walk that increases your heart rate and breathing. This should be done on most days of the week.   Find a type of exercise or a variety of exercises that you enjoy so that it becomes a part of your daily life.  Examples are running, walking, swimming, water aerobics, and biking.  For motivation and support, explore group exercise such as aerobic class, spin class, Zumba, Yoga,or  martial arts, etc.    Set exercise goals for yourself, such as a certain weight goal, walk or run in a race such as a 5k walk/run.  Speak to your primary care provider about exercise goals.  Disease prevention:  If you smoke or chew tobacco, find out from your caregiver how to quit. It can literally save your life, no matter how long you have been a tobacco user. If you do not use tobacco, never begin.   Maintain a healthy diet and normal weight. Increased weight leads to problems  with blood pressure and diabetes.   The Body Mass Index or BMI is a way of measuring how much of your body is fat. Having a BMI above 27 increases the risk of heart disease, diabetes, hypertension, stroke and other problems related to obesity. Your caregiver can help determine your BMI and based on it develop an exercise and dietary program to help you achieve or maintain this important measurement at a healthful  level.  High blood pressure causes heart and blood vessel problems.  Persistent high blood pressure should be treated with medicine if weight loss and exercise do not work.   Fat and cholesterol leaves deposits in your arteries that can block them. This causes heart disease and vessel disease elsewhere in your body.  If your cholesterol is found to be high, or if you have heart disease or certain other medical conditions, then you may need to have your cholesterol monitored frequently and be treated with medication.   Ask if you should have a cardiac stress test if your history suggests this. A stress test is a test done on a treadmill that looks for heart disease. This test can find disease prior to there being a problem.  Menopause can be associated with physical symptoms and risks. Hormone replacement therapy is available to decrease these. You should talk to your caregiver about whether starting or continuing to take hormones is right for you.   Osteoporosis is a disease in which the bones lose minerals and strength as we age. This can result in serious bone fractures. Risk of osteoporosis can be identified using a bone density scan. Women ages 20 and over should discuss this with their caregivers, as should women after menopause who have other risk factors. Ask your caregiver whether you should be taking a calcium supplement and Vitamin D, to reduce the rate of osteoporosis.   Avoid drinking alcohol in excess (more than two drinks per day).  Avoid use of street drugs. Do not share needles with anyone. Ask for professional help if you need assistance or instructions on stopping the use of alcohol, cigarettes, and/or drugs.  Brush your teeth twice a day with fluoride toothpaste, and floss once a day. Good oral hygiene prevents tooth decay and gum disease. The problems can be painful, unattractive, and can cause other health problems. Visit your dentist for a routine oral and dental check up and  preventive care every 6-12 months.   Look at your skin regularly.  Use a mirror to look at your back. Notify your caregivers of changes in moles, especially if there are changes in shapes, colors, a size larger than a pencil eraser, an irregular border, or development of new moles.  Safety:  Use seatbelts 100% of the time, whether driving or as a passenger.  Use safety devices such as hearing protection if you work in environments with loud noise or significant background noise.  Use safety glasses when doing any work that could send debris in to the eyes.  Use a helmet if you ride a bike or motorcycle.  Use appropriate safety gear for contact sports.  Talk to your caregiver about gun safety.  Use sunscreen with a SPF (or skin protection factor) of 15 or greater.  Lighter skinned people are at a greater risk of skin cancer. Don't forget to also wear sunglasses in order to protect your eyes from too much damaging sunlight. Damaging sunlight can accelerate cataract formation.   Practice safe sex. Use condoms. Condoms are used for  birth control and to help reduce the spread of sexually transmitted infections (or STIs).  Some of the STIs are gonorrhea (the clap), chlamydia, syphilis, trichomonas, herpes, HPV (human papilloma virus) and HIV (human immunodeficiency virus) which causes AIDS. The herpes, HIV and HPV are viral illnesses that have no cure. These can result in disability, cancer and death.   Keep carbon monoxide and smoke detectors in your home functioning at all times. Change the batteries every 6 months or use a model that plugs into the wall.   Vaccinations:  Stay up to date with your tetanus shots and other required immunizations. You should have a booster for tetanus every 10 years. Be sure to get your flu shot every year, since 5%-20% of the [place] population comes down with the flu. The flu vaccine changes each year, so being vaccinated once is not enough. Get your shot in the fall, before  the flu season peaks.   Other vaccines to consider:  Human Papilloma Virus or HPV causes cancer of the cervix, and other infections that can be transmitted from person to person. There is a vaccine for HPV, and females should get immunized between the ages of 64 and 2. It requires a series of 3 shots.   Pneumococcal vaccine to protect against certain types of pneumonia.  This is normally recommended for adults age 44 or older.  However, adults younger than 40 years old with certain underlying conditions such as diabetes, heart or lung disease should also receive the vaccine.  Shingles vaccine to protect against Varicella Zoster if you are older than age 72, or younger than 40 years old with certain underlying illness.  Hepatitis A vaccine to protect against a form of infection of the liver by a virus acquired from food.  Hepatitis B vaccine to protect against a form of infection of the liver by a virus acquired from blood or body fluids, particularly if you work in health care.  If you plan to travel internationally, check with your local health department for specific vaccination recommendations.  Cancer Screening:  Breast cancer screening is essential to preventive care for women. All women age 79 and older should perform a breast self-exam every month. At age 79 and older, women should have their caregiver complete a breast exam each year. Women at ages 30 and older should have a mammogram (x-ray film) of the breasts. Your caregiver can discuss how often you need mammograms.    Cervical cancer screening includes taking a Pap smear (sample of cells examined under a microscope) from the cervix (end of the uterus). It also includes testing for HPV (Human Papilloma Virus, which can cause cervical cancer). Screening and a pelvic exam should begin at age 63, or 3 years after a woman becomes sexually active. Screening should occur every year, with a Pap smear but no HPV testing, up to age 29. After  age 16, you should have a Pap smear every 3 years with HPV testing, if no HPV was found previously.   Most routine colon cancer screening begins at the age of 59. On a yearly basis, doctors may provide special easy to use take-home tests to check for hidden blood in the stool. Sigmoidoscopy or colonoscopy can detect the earliest forms of colon cancer and is life saving. These tests use a small camera at the end of a tube to directly examine the colon. Speak to your caregiver about this at age 45, when routine screening begins (and is repeated every 5 years  unless early forms of pre-cancerous polyps or small growths are found).

## 2016-03-08 ENCOUNTER — Other Ambulatory Visit: Payer: Managed Care, Other (non HMO)

## 2016-03-08 DIAGNOSIS — Z0000 Encounter for general adult medical examination without abnormal findings: Secondary | ICD-10-CM

## 2016-03-08 DIAGNOSIS — Z13220 Encounter for screening for lipoid disorders: Secondary | ICD-10-CM

## 2016-03-08 DIAGNOSIS — Z113 Encounter for screening for infections with a predominantly sexual mode of transmission: Secondary | ICD-10-CM

## 2016-03-08 LAB — COMPREHENSIVE METABOLIC PANEL
ALBUMIN: 4.8 g/dL (ref 3.6–5.1)
ALT: 24 U/L (ref 6–29)
AST: 27 U/L (ref 10–30)
Alkaline Phosphatase: 43 U/L (ref 33–115)
BUN: 16 mg/dL (ref 7–25)
CALCIUM: 9.3 mg/dL (ref 8.6–10.2)
CHLORIDE: 102 mmol/L (ref 98–110)
CO2: 19 mmol/L — ABNORMAL LOW (ref 20–31)
Creat: 0.86 mg/dL (ref 0.50–1.10)
Glucose, Bld: 77 mg/dL (ref 65–99)
POTASSIUM: 4.2 mmol/L (ref 3.5–5.3)
Sodium: 137 mmol/L (ref 135–146)
TOTAL PROTEIN: 7.5 g/dL (ref 6.1–8.1)
Total Bilirubin: 0.7 mg/dL (ref 0.2–1.2)

## 2016-03-08 LAB — TSH: TSH: 1.07 m[IU]/L

## 2016-03-08 LAB — CBC WITH DIFFERENTIAL/PLATELET
BASOS ABS: 0 {cells}/uL (ref 0–200)
Basophils Relative: 0 %
EOS ABS: 82 {cells}/uL (ref 15–500)
Eosinophils Relative: 2 %
HEMATOCRIT: 44.9 % (ref 35.0–45.0)
HEMOGLOBIN: 15.3 g/dL (ref 11.7–15.5)
Lymphocytes Relative: 14 %
Lymphs Abs: 574 cells/uL — ABNORMAL LOW (ref 850–3900)
MCH: 33.3 pg — AB (ref 27.0–33.0)
MCHC: 34.1 g/dL (ref 32.0–36.0)
MCV: 97.8 fL (ref 80.0–100.0)
MONOS PCT: 8 %
MPV: 11.1 fL (ref 7.5–12.5)
Monocytes Absolute: 328 cells/uL (ref 200–950)
NEUTROS ABS: 3116 {cells}/uL (ref 1500–7800)
Neutrophils Relative %: 76 %
PLATELETS: 211 10*3/uL (ref 140–400)
RBC: 4.59 MIL/uL (ref 3.80–5.10)
RDW: 12.7 % (ref 11.0–15.0)
WBC: 4.1 10*3/uL (ref 4.0–10.5)

## 2016-03-08 LAB — LIPID PANEL
Cholesterol: 208 mg/dL — ABNORMAL HIGH (ref 125–200)
HDL: 101 mg/dL (ref >=46)
LDL Cholesterol: 96 mg/dL (ref <130)
Total CHOL/HDL Ratio: 2.1 ratio (ref <=5.0)
Triglycerides: 55 mg/dL (ref <150)
VLDL: 11 mg/dL (ref <30)

## 2016-03-08 LAB — GC/CHLAMYDIA PROBE AMP
CT PROBE, AMP APTIMA: NOT DETECTED
GC Probe RNA: NOT DETECTED

## 2016-03-09 LAB — RPR

## 2016-03-09 LAB — CYTOLOGY - PAP

## 2016-03-09 LAB — HIV ANTIBODY (ROUTINE TESTING W REFLEX): HIV 1&2 Ab, 4th Generation: NONREACTIVE

## 2016-03-13 ENCOUNTER — Other Ambulatory Visit: Payer: Self-pay | Admitting: Family Medicine

## 2016-03-13 ENCOUNTER — Telehealth: Payer: Self-pay | Admitting: Internal Medicine

## 2016-03-13 DIAGNOSIS — R8789 Other abnormal findings in specimens from female genital organs: Secondary | ICD-10-CM

## 2016-03-13 DIAGNOSIS — N6459 Other signs and symptoms in breast: Secondary | ICD-10-CM

## 2016-03-13 DIAGNOSIS — R87610 Atypical squamous cells of undetermined significance on cytologic smear of cervix (ASC-US): Secondary | ICD-10-CM

## 2016-03-13 DIAGNOSIS — R87810 Cervical high risk human papillomavirus (HPV) DNA test positive: Principal | ICD-10-CM

## 2016-03-13 DIAGNOSIS — R87618 Other abnormal cytological findings on specimens from cervix uteri: Secondary | ICD-10-CM

## 2016-03-13 DIAGNOSIS — N879 Dysplasia of cervix uteri, unspecified: Secondary | ICD-10-CM

## 2016-03-13 DIAGNOSIS — R87619 Unspecified abnormal cytological findings in specimens from cervix uteri: Secondary | ICD-10-CM

## 2016-03-13 NOTE — Telephone Encounter (Signed)
-----   Message from Avanell ShackletonVickie L Henson, NP sent at 03/12/2016  9:03 PM EDT ----- Please call her and find out if she knows of a gynecologist or if she wants us to recommend someone. Please put in referral for ASCUS (abnormal cells) and HPV positive.

## 2016-03-13 NOTE — Addendum Note (Signed)
Addended by: Herminio CommonsJOHNSON, Marca Gadsby A on: 03/13/2016 09:34 AM   Modules accepted: Orders, SmartSet

## 2016-03-13 NOTE — Telephone Encounter (Signed)
This encounter was created in error - please disregard.

## 2016-03-13 NOTE — Telephone Encounter (Signed)
Spoke to patient and I have put referral in to epic

## 2016-03-14 ENCOUNTER — Encounter: Payer: Self-pay | Admitting: Family Medicine

## 2016-03-14 ENCOUNTER — Telehealth: Payer: Self-pay | Admitting: Family Medicine

## 2016-03-14 NOTE — Telephone Encounter (Signed)
Pt called to let [name] know that she would like to see Joan QuailSuzanne Miller for her abnormal pap if at all possible

## 2016-03-14 NOTE — Telephone Encounter (Signed)
I have put in for her to see susanne miller

## 2016-03-15 ENCOUNTER — Encounter: Payer: Self-pay | Admitting: Internal Medicine

## 2016-03-16 ENCOUNTER — Telehealth: Payer: Self-pay | Admitting: Obstetrics & Gynecology

## 2016-03-16 NOTE — Telephone Encounter (Signed)
Patient returning call.

## 2016-03-20 ENCOUNTER — Ambulatory Visit: Payer: Self-pay

## 2016-03-20 ENCOUNTER — Ambulatory Visit
Admission: RE | Admit: 2016-03-20 | Discharge: 2016-03-20 | Disposition: A | Discharge status: Home / Self Care | Payer: Managed Care, Other (non HMO) | Source: Ambulatory Visit | Attending: Family Medicine | Admitting: Family Medicine

## 2016-03-20 DIAGNOSIS — R10815 Periumbilic abdominal tenderness: Secondary | ICD-10-CM

## 2016-03-20 DIAGNOSIS — R108A3 Suprapubic tenderness: Secondary | ICD-10-CM

## 2016-03-20 DIAGNOSIS — R10819 Abdominal tenderness, unspecified site: Secondary | ICD-10-CM

## 2016-03-26 ENCOUNTER — Ambulatory Visit: Payer: Managed Care, Other (non HMO) | Admitting: Obstetrics and Gynecology

## 2016-04-02 ENCOUNTER — Ambulatory Visit (INDEPENDENT_AMBULATORY_CARE_PROVIDER_SITE_OTHER): Payer: Managed Care, Other (non HMO) | Admitting: Obstetrics and Gynecology

## 2016-04-02 ENCOUNTER — Encounter: Payer: Self-pay | Admitting: Obstetrics and Gynecology

## 2016-04-02 VITALS — BP 98/58 | HR 64 | Resp 14 | Ht 61.0 in | Wt 131.0 lb

## 2016-04-02 DIAGNOSIS — Z01812 Encounter for preprocedural laboratory examination: Secondary | ICD-10-CM | POA: Diagnosis not present

## 2016-04-02 DIAGNOSIS — R87610 Atypical squamous cells of undetermined significance on cytologic smear of cervix (ASC-US): Secondary | ICD-10-CM | POA: Diagnosis not present

## 2016-04-02 DIAGNOSIS — B977 Papillomavirus as the cause of diseases classified elsewhere: Secondary | ICD-10-CM

## 2016-04-02 LAB — POCT URINE PREGNANCY: PREG TEST UR: NEGATIVE

## 2016-04-02 NOTE — Progress Notes (Signed)
40 y.o. [id number] SingleCaucasianF here for a consultation by Hetty BlendVickie Henson, NP for a consultation for an ASCUS/+HPV pap.  This is her first abnormal pap. Recent negative cervical cultures.    No LMP recorded. Patient is not currently having periods (Reason: IUD).          Sexually active: Yes.    The current method of family planning is IUD.    Exercising: Yes.    running / weights Smoker:  Former   Health Maintenance: Pap:  03-07-16 ASCUS +HRHPV History of abnormal Pap:  yes MMG:  Never Colonoscopy:  Never BMD:   Never TDaP:  03-03-15 Gardasil: N/A   reports that she quit smoking about 10 years ago. She has never used smokeless tobacco. She reports that she drinks about 1.2 - 1.8 oz of alcohol per week . She reports that she does not use drugs.She has 372 kids, 40 year old daughter and 40 year old son.    Past Medical History:  Diagnosis Date  . Recurrent sinusitis   . Seasonal allergies     No past surgical history on file.  No current outpatient prescriptions on file.   No current facility-administered medications for this visit.     Family History  Problem Relation Age of Onset  . Hypertension Mother   . Cancer Mother [id number]    Rectal  . Heart attack Paternal Grandfather     Review of Systems  Constitutional: Negative.   HENT: Negative.   Eyes: Negative.   Respiratory: Negative.   Cardiovascular: Negative.   Gastrointestinal: Negative.   Endocrine: Negative.   Genitourinary: Negative.   Musculoskeletal: Negative.   Skin: Negative.   Allergic/Immunologic: Negative.   Neurological: Negative.   Psychiatric/Behavioral: Negative.     Exam:   BP (!) 98/58 (BP Location: Right Arm, Patient Position: Sitting, Cuff Size: Normal)   Pulse 64   Resp 14   Ht 5\' 1"  (1.549 m)   Wt 131 lb (59.4 kg)   BMI 24.75 kg/m   Weight change: @WEIGHTCHANGE @ Height:   Height: 5\' 1"  (154.9 cm)  Ht Readings from Last 3 Encounters:  04/02/16 5\' 1"  (1.549 m)  03/03/15 5' 1.5" (1.562 m)     General appearance: alert, cooperative and appears stated age   Pelvic: External genitalia:  no lesions              Urethra:  normal appearing urethra with no masses, tenderness or lesions              Bartholins and Skenes: normal                 Vagina: normal appearing vagina with normal color and discharge, no lesions              Cervix: grossly normal  Colposcopy: satisfactory with the use of a cotton swab. Mild aceto-white changes at 1 o'clock, biopsy done. ECC done. Negative lugols examination of the upper vagina. Hemostasis obtained with silver nitrate and monsels  Chaperone was present for exam.  A:  ASCUS, +HPV pap  P:   Discussed abnormal pap's, cervical dysplasia, possible need for treatment, definite need for f/u pap's  Colposcopy of cervix, upper vagina with biopsy and ECC  Further plans based on results  CC: Hetty BlendVickie Henson, NP Letter sent

## 2016-04-02 NOTE — Patient Instructions (Signed)

## 2016-04-04 ENCOUNTER — Encounter: Payer: Self-pay | Admitting: Family Medicine

## 2016-04-06 ENCOUNTER — Telehealth: Payer: Self-pay

## 2016-04-06 DIAGNOSIS — N871 Moderate cervical dysplasia: Secondary | ICD-10-CM

## 2016-04-06 NOTE — Telephone Encounter (Signed)
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 She needs a leep. Please inform the patient and set up the appointment.

## 2016-04-06 NOTE — Telephone Encounter (Signed)
Left message to call Kaitlyn at [phone]. 

## 2016-04-16 NOTE — Telephone Encounter (Signed)
Spoke with patient. Advised patient of message as results as seen below from Dr.Jertson. Patient verbalizes understanding. Patient has a IUD in place. LEEP scheduled for 04/23/2016 at 9 am with Dr.Jertson. Patient is agreeable to date and time.  Instructions given. Motrin 800 mg po x , one hour before appointment with food. Make sure to eat a meal before appointment and drink plenty of fluids. Aware it is recommended that she take 24 hours to rest following LEEP procedure. Aware it is recommended to have someone accompany her to her appointment to drive her home following her LEEP procedure. Patient agreeable and verbalized understanding of all instructions. LEEP order placed for precert.  Routing to provider for final review. Patient agreeable to disposition. Will close encounter.

## 2016-04-23 ENCOUNTER — Encounter: Payer: Self-pay | Admitting: Obstetrics and Gynecology

## 2016-04-23 ENCOUNTER — Ambulatory Visit (INDEPENDENT_AMBULATORY_CARE_PROVIDER_SITE_OTHER): Payer: Managed Care, Other (non HMO) | Admitting: Obstetrics and Gynecology

## 2016-04-23 VITALS — BP 110/72 | HR 56 | Resp 14 | Wt 126.0 lb

## 2016-04-23 DIAGNOSIS — N871 Moderate cervical dysplasia: Secondary | ICD-10-CM | POA: Diagnosis not present

## 2016-04-23 DIAGNOSIS — Z01812 Encounter for preprocedural laboratory examination: Secondary | ICD-10-CM

## 2016-04-23 LAB — POCT URINE PREGNANCY: PREG TEST UR: NEGATIVE

## 2016-04-23 NOTE — Patient Instructions (Signed)

## 2016-04-23 NOTE — Progress Notes (Signed)
GYNECOLOGY  VISIT   HPI: 40 y.o.   Single  Caucasian  female   G2P2002 with No LMP recorded. Patient is not currently having periods (Reason: IUD).   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 U/S from 03/20/16 IUD in place.   GYNECOLOGIC HISTORY: No LMP recorded. Patient is not currently having periods (Reason: IUD). Contraception:IUD (Mirena) Menopausal hormone therapy: none         OB History    Gravida Para Term Preterm AB Living   2 2 2     2    SAB TAB Ectopic Multiple Live Births           2         There are no active problems to display for this patient.   Past Medical History:  Diagnosis Date  . Recurrent sinusitis   . Seasonal allergies     No past surgical history on file.  Current Outpatient Prescriptions  Medication Sig Dispense Refill  . levonorgestrel (MIRENA) 20 MCG/24HR IUD 1 each by Intrauterine route once.     No current facility-administered medications for this visit.      ALLERGIES: Patient has no known allergies.  Family History  Problem Relation Age of Onset  . Hypertension Mother   . Cancer Mother [id number]    Rectal  . Heart attack Paternal Grandfather     Social History   Social History  . Marital status: Single    Spouse name: N/A  . Number of children: N/A  . Years of education: N/A   Occupational History  . Not on file.   Social History Main Topics  . Smoking status: Former Smoker    Quit date: 01/09/2006  . Smokeless tobacco: Never Used  . Alcohol use 1.2 - 1.8 oz/week    2 - 3 Standard drinks or equivalent per week     Comment: every saturday- social  . Drug use: No  . Sexual activity: Yes    Partners: Male    Birth control/ protection: IUD   Other Topics Concern  . Not on file   Social History Narrative  . No narrative on file    Review of Systems  Constitutional: Negative.   HENT: Negative.   Eyes: Negative.   Respiratory: Negative.   Cardiovascular: Negative.    Gastrointestinal: Negative.   Genitourinary: Negative.   Musculoskeletal: Negative.   Skin: Negative.   Neurological: Negative.   Endo/Heme/Allergies: Negative.   Psychiatric/Behavioral: Negative.     PHYSICAL EXAMINATION:    BP 110/72 (BP Location: Right Arm, Patient Position: Sitting, Cuff Size: Normal)   Pulse (!) 56   Resp 14   Wt 126 lb (57.2 kg)   BMI 23.81 kg/m     General appearance: alert, cooperative and appears stated age   Procedure: The patient was counseled as to the risks of the procedure, including: infection, bleeding, future pregnancy risks and cervical stenosis. A consent form was signed.  Acetic acid and then Lugols solution was placed on the cervix. The cervix was inspected with the colposcope and a paracervical block was injected using 1% lidocaine with epinephrine. The 2 x 0.8 cm loop was used to remove a portion of the ectocervix taking care to get the entire transformation zone.  A second 1 x 1 cm loop was used to remove a portion of the endocervix. The settings were 55 cut, 50 coag with a blend of 1.  An ECC was  performed. The cautery ball was then used to cauterize the base of the biopsy site and monsels were placed. The patient tolerated the procedure well.    Chaperone was present for exam.  ASSESSMENT CIN II, +ECC IUD strings not seen on exam, in place on recent ultrasound    PLAN Loop cone, ECC F/U in 1 month Call with any concerns   An After Visit Summary was printed and given to the patient.    CC: Hetty BlendVickie Henson, NP

## 2016-05-14 ENCOUNTER — Other Ambulatory Visit (INDEPENDENT_AMBULATORY_CARE_PROVIDER_SITE_OTHER): Payer: Managed Care, Other (non HMO)

## 2016-05-14 ENCOUNTER — Ambulatory Visit (INDEPENDENT_AMBULATORY_CARE_PROVIDER_SITE_OTHER): Payer: Managed Care, Other (non HMO) | Admitting: Gastroenterology

## 2016-05-14 ENCOUNTER — Encounter: Payer: Self-pay | Admitting: Gastroenterology

## 2016-05-14 VITALS — BP 104/60 | HR 64 | Ht 61.0 in | Wt 129.0 lb

## 2016-05-14 DIAGNOSIS — Z800 Family history of malignant neoplasm of digestive organs: Secondary | ICD-10-CM

## 2016-05-14 DIAGNOSIS — R140 Abdominal distension (gaseous): Secondary | ICD-10-CM | POA: Diagnosis not present

## 2016-05-14 LAB — IGA: IGA: 298 mg/dL (ref 68–378)

## 2016-05-14 MED ORDER — NA SULFATE-K SULFATE-MG SULF 17.5-3.13-1.6 GM/177ML PO SOLN
1.0000 | Freq: Once | ORAL | 0 refills | Status: AC
Start: 2016-05-14 — End: 2016-05-14

## 2016-05-14 NOTE — Progress Notes (Signed)
HPI :  40 y/o female here for a new patient visit to discuss possible colonoscopy. Her mother was diagnosed with colon cancer at age 40 and recently passed away.  She thinks she may have had a colonoscopy at her 6720s, she thinks it was normal.   She denies any trouble with her bowels. She reports some stress in her life due to multiple deaths in the family and has some bloating more than usual. She is sensitive to certain foods which can precipitate bloating and abdominal distension. Dairy can bother her in particular. She denies any constipation or diarrhea. She is having 2 BMs per day. No blood in the stools. She eats a ot of vegetables. No family history of IBD or celiac disease.    Past Medical History:  Diagnosis Date  . Recurrent sinusitis   . Seasonal allergies      Past Surgical History:  Procedure Laterality Date  . ABCESS DRAINAGE     tonsil  . fatty tumor    . LEEP     Family History  Problem Relation Age of Onset  . Hypertension Mother   . Rectal cancer Mother [id number]  . Heart attack Paternal Grandfather   . Stomach cancer Neg Hx    Social History  Substance Use Topics  . Smoking status: Former Smoker    Quit date: 01/09/2006  . Smokeless tobacco: Never Used  . Alcohol use 1.2 - 1.8 oz/week    2 - 3 Standard drinks or equivalent per week     Comment: every saturday- social   Current Outpatient Prescriptions  Medication Sig Dispense Refill  . 5-Hydroxytryptophan (5-HTP PO) Take by mouth.    . levonorgestrel (MIRENA) 20 MCG/24HR IUD 1 each by Intrauterine route once.    . S-Adenosylmethionine (SAM-E PO) Take by mouth.     No current facility-administered medications for this visit.    No Known Allergies   Review of Systems: All systems reviewed and negative except where noted in HPI.   Lab Results  Component Value Date   WBC 4.1 03/08/2016   HGB 15.3 03/08/2016   HCT 44.9 03/08/2016   MCV 97.8 03/08/2016   PLT 211 03/08/2016    Lab Results  Component  Value Date   CREATININE 0.86 03/08/2016   BUN 16 03/08/2016   NA 137 03/08/2016   K 4.2 03/08/2016   CL 102 03/08/2016   CO2 19 (L) 03/08/2016    Lab Results  Component Value Date   ALT 24 03/08/2016   AST 27 03/08/2016   ALKPHOS 43 03/08/2016   BILITOT 0.7 03/08/2016    Physical Exam: BP 104/60   Pulse 64   Ht 5\' 1"  (1.549 m)   Wt 129 lb (58.5 kg)   BMI 24.37 kg/m  Constitutional: Pleasant,well-developed, female in no acute distress. HEENT: Normocephalic and atraumatic. Conjunctivae are normal. No scleral icterus. Neck supple.  Cardiovascular: Normal rate, regular rhythm.  Pulmonary/chest: Effort normal and breath sounds normal. No wheezing, rales or rhonchi. Abdominal: Soft, nondistended, nontender.  There are no masses palpable. No hepatomegaly. Extremities: no edema Lymphadenopathy: No cervical adenopathy noted. Neurological: Alert and oriented to person place and time. Skin: Skin is warm and dry. No rashes noted. Psychiatric: Normal mood and affect. Behavior is normal.   ASSESSMENT AND PLAN: 40 year old female with a strong family history of colon cancer here for new patient evaluation:  Colon cancer screening - given her strong family history of colon cancer she is due for optical colonoscopy  at this time. No anemia or alarm symptoms. I discussed risks and benefits of anesthesia and colonoscopy with her, after this discussion she wished to proceed. Further recommendations pending the result  Bloating - suspect this may be dietary related, but will screen for celiac disease. Otherwise I counseled her on a low FODMAP diet which she wished to try for her bloating. If her symptoms persist despite this she should contact me for reassessment.  Ileene PatrickSteven Armbruster, MD [facility] Pager [phone]  CC: Avanell ShackletonHenson, Vickie L, NP

## 2016-05-14 NOTE — Patient Instructions (Addendum)
If you are age 40 or older, your body mass index should be between 23-30. Your Body mass index is 24.37 kg/m. If this is out of the aforementioned range listed, please consider follow up with your Primary Care Provider.  If you are age 40 or younger, your body mass index should be between 19-25. Your Body mass index is 24.37 kg/m. If this is out of the aformentioned range listed, please consider follow up with your Primary Care Provider.   We have sent the following medications to your pharmacy for you to pick up at your convenience:  Suprep  You have been scheduled for a colonoscopy. Please follow written instructions given to you at your visit today.  Please pick up your prep supplies at the pharmacy within the next 1-3 days. If you use inhalers (even only as needed), please bring them with you on the day of your procedure. Your physician has requested that you go to www.startemmi.com and enter the access code given to you at your visit today. This web site gives a general overview about your procedure. However, you should still follow specific instructions given to you by our office regarding your preparation for the procedure.  You have been given a Low FodMap diet.  Your physician has requested that you go to the basement for the following lab work before leaving today:  IGA, TTG  Thank you.

## 2016-05-15 LAB — TISSUE TRANSGLUTAMINASE, IGA: Tissue Transglutaminase Ab, IgA: 1 U/mL (ref <4)

## 2016-05-16 ENCOUNTER — Encounter: Payer: Self-pay | Admitting: Gastroenterology

## 2016-05-16 NOTE — Progress Notes (Signed)
Letter mailed

## 2016-05-21 ENCOUNTER — Ambulatory Visit (INDEPENDENT_AMBULATORY_CARE_PROVIDER_SITE_OTHER): Payer: Managed Care, Other (non HMO) | Admitting: Obstetrics and Gynecology

## 2016-05-21 ENCOUNTER — Encounter: Payer: Self-pay | Admitting: Obstetrics and Gynecology

## 2016-05-21 VITALS — BP 110/70 | HR 64 | Resp 14 | Ht 61.0 in | Wt 129.0 lb

## 2016-05-21 DIAGNOSIS — Z9889 Other specified postprocedural states: Secondary | ICD-10-CM | POA: Diagnosis not present

## 2016-05-21 DIAGNOSIS — Z87410 Personal history of cervical dysplasia: Secondary | ICD-10-CM | POA: Diagnosis not present

## 2016-05-21 NOTE — Progress Notes (Signed)
GYNECOLOGY  VISIT   HPI: 40 y.o.   Single  Caucasian  female   G2P2002 with No LMP recorded. Patient is not currently having periods (Reason: IUD).   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 She is been having slight spotting.   She has an IUD, no regular cycles. Strings were missing prior to IUD. In place on U/S  GYNECOLOGIC HISTORY: No LMP recorded. Patient is not currently having periods (Reason: IUD). Contraception:Mirena Menopausal hormone therapy: n/a        OB History    Gravida Para Term Preterm AB Living   2 2 2     2    SAB TAB Ectopic Multiple Live Births           2         There are no active problems to display for this patient.   Past Medical History:  Diagnosis Date  . Recurrent sinusitis   . Seasonal allergies     Past Surgical History:  Procedure Laterality Date  . ABCESS DRAINAGE     tonsil  . fatty tumor    . LEEP      Current Outpatient Prescriptions  Medication Sig Dispense Refill  . 5-Hydroxytryptophan (5-HTP PO) Take by mouth.    . levonorgestrel (MIRENA) 20 MCG/24HR IUD 1 each by Intrauterine route once.    . S-Adenosylmethionine (SAM-E PO) Take by mouth.     No current facility-administered medications for this visit.      ALLERGIES: Patient has no known allergies.  Family History  Problem Relation Age of Onset  . Hypertension Mother   . Rectal cancer Mother 856  . Heart attack Paternal Grandfather   . Stomach cancer Neg Hx     Social History   Social History  . Marital status: Single    Spouse name: N/A  . Number of children: N/A  . Years of education: N/A   Occupational History  . Not on file.   Social History Main Topics  . Smoking status: Former Smoker    Quit date: 01/09/2006  . Smokeless tobacco: Never Used  . Alcohol use 1.2 - 1.8 oz/week    2 - 3 Standard drinks or equivalent per week     Comment: every saturday- social  . Drug use: No  . Sexual activity: Yes     Partners: Male    Birth control/ protection: IUD   Other Topics Concern  . Not on file   Social History Narrative  . No narrative on file    Review of Systems  Constitutional: Negative.   HENT: Negative.   Eyes: Negative.   Respiratory: Negative.   Cardiovascular: Negative.   Gastrointestinal: Negative.   Genitourinary: Negative.   Musculoskeletal: Negative.   Skin: Negative.   Neurological: Negative.   Endo/Heme/Allergies: Negative.   Psychiatric/Behavioral: Negative.     PHYSICAL EXAMINATION:    BP 110/70 (BP Location: Right Arm, Patient Position: Sitting, Cuff Size: Normal)   Pulse 64   Resp 14   Ht 5\' 1"  (1.549 m)   Wt 129 lb (58.5 kg)   BMI 24.37 kg/m     General appearance: alert, cooperative and appears stated age  Pelvic: External genitalia:  no lesions              Urethra:  normal appearing urethra with no masses, tenderness or lesions  Bartholins and Skenes: normal                 Vagina: normal appearing vagina with normal color and discharge, no lesions              Cervix: without lesions, well healed.   Chaperone was present for exam.  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    PLAN Doing well Needs a pap with hpv at her next annual   An After Visit Summary was printed and given to the patient.   CC: Hetty BlendVickie Henson, NP

## 2016-06-06 ENCOUNTER — Ambulatory Visit: Payer: Managed Care, Other (non HMO)

## 2016-06-14 ENCOUNTER — Ambulatory Visit: Payer: Managed Care, Other (non HMO)

## 2016-07-03 ENCOUNTER — Telehealth: Payer: Self-pay | Admitting: Gastroenterology

## 2016-07-03 ENCOUNTER — Encounter: Payer: Managed Care, Other (non HMO) | Admitting: Gastroenterology

## 2016-07-03 NOTE — Telephone Encounter (Signed)
Okay thanks. She can reschedule at her convenience.

## 2017-03-07 ENCOUNTER — Encounter: Payer: Self-pay | Admitting: Family Medicine

## 2017-03-07 ENCOUNTER — Encounter: Payer: Managed Care, Other (non HMO) | Admitting: Family Medicine

## 2017-03-07 NOTE — Progress Notes (Deleted)
   Subjective:    Patient ID: Joan Hernandez, female    DOB: 05/01/1976, 41 y.o.   MRN: [id number]  HPI No chief complaint on file.  She is here for a complete physical exam.  Other providers: Dr. Oscar La- OB/GYN. Dr. Adela Lank- GI   Past medical history: Surgeries:  Family history: Mental Health History:  Social history: Lives with ***, works as ***, *** Smoking, drinking alcohol, drug use  Diet: *** Excerise: ***  Immunizations:  Health maintenance:  Mammogram: Colonoscopy: Last Gynecological Exam: Last Menstrual cycle: Pregnancies:  Last Dental Exam: Last Eye Exam:  Wears seatbelt always, uses sunscreen, smoke detectors in home and functioning, does not text while driving and feels safe in home environment.   Reviewed allergies, medications, past medical, surgical, family, and social history.   Review of Systems Review of Systems Constitutional: -fever, -chills, -sweats, -unexpected weight change,-fatigue ENT: -runny nose, -ear pain, -sore throat Cardiology:  -chest pain, -palpitations, -edema Respiratory: -cough, -shortness of breath, -wheezing Gastroenterology: -abdominal pain, -nausea, -vomiting, -diarrhea, -constipation  Hematology: -bleeding or bruising problems Musculoskeletal: -arthralgias, -myalgias, -joint swelling, -back pain Ophthalmology: -vision changes Urology: -dysuria, -difficulty urinating, -hematuria, -urinary frequency, -urgency Neurology: -headache, -weakness, -tingling, -numbness       Objective:   Physical Exam There were no vitals taken for this visit.  General Appearance:    Alert, cooperative, no distress, appears stated age  Head:    Normocephalic, without obvious abnormality, atraumatic  Eyes:    PERRL, conjunctiva/corneas clear, EOM's intact, fundi    benign  Ears:    Normal TM's and external ear canals  Nose:   Nares normal, mucosa normal, no drainage or sinus   tenderness  Throat:   Lips, mucosa, and tongue normal; teeth  and gums normal  Neck:   Supple, no lymphadenopathy;  thyroid:  no   enlargement/tenderness/nodules; no carotid   bruit or JVD  Back:    Spine nontender, no curvature, ROM normal, no CVA     tenderness  Lungs:     Clear to auscultation bilaterally without wheezes, rales or     ronchi; respirations unlabored  Chest Wall:    No tenderness or deformity   Heart:    Regular rate and rhythm, S1 and S2 normal, no murmur, rub   or gallop  Breast Exam:    No tenderness, masses, or nipple discharge or inversion.      No axillary lymphadenopathy  Abdomen:     Soft, non-tender, nondistended, normoactive bowel sounds,    no masses, no hepatosplenomegaly  Genitalia:    Normal external genitalia without lesions.  BUS and vagina normal; cervix without lesions, or cervical motion tenderness. No abnormal vaginal discharge.  Uterus and adnexa not enlarged, nontender, no masses.  Pap performed  Rectal:    Normal tone, no masses or tenderness; guaiac negative stool  Extremities:   No clubbing, cyanosis or edema  Pulses:   2+ and symmetric all extremities  Skin:   Skin color, texture, turgor normal, no rashes or lesions  Lymph nodes:   Cervical, supraclavicular, and axillary nodes normal  Neurologic:   CNII-XII intact, normal strength, sensation and gait; reflexes 2+ and symmetric throughout          Psych:   Normal mood, affect, hygiene and grooming.    Urinalysis dipstick:       Assessment & Plan:  Routine general medical examination at a health care facility

## 2017-03-13 ENCOUNTER — Encounter: Payer: Self-pay | Admitting: Family Medicine

## 2017-05-14 ENCOUNTER — Telehealth: Payer: Self-pay | Admitting: *Deleted

## 2017-05-14 NOTE — Telephone Encounter (Signed)
Left message to call and schedule AEX/PAP. Patient is in 08 recall for 04/2017 .-eh

## 2017-05-27 NOTE — Telephone Encounter (Signed)
Left another message to call to schedule AEX/PAP -eh

## 2017-06-10 NOTE — Telephone Encounter (Signed)
Patient has not returned call regarding 08 recall. Please advise on recall status/ letter -eh

## 2017-06-11 NOTE — Telephone Encounter (Signed)
Please send her a letter, then close the encounter.

## 2017-06-12 ENCOUNTER — Encounter: Payer: Self-pay | Admitting: *Deleted

## 2017-06-12 NOTE — Telephone Encounter (Signed)
Letter sent- recall removed -eh 

## 2018-02-13 IMAGING — US US TRANSVAGINAL NON-OB
1 series · 14 of 25 positions shown · non-contrast
Comparison: None in PACs

CLINICAL DATA: Mid pelvic pain since December 2015. The patient has Noel
Elmer Adolfo IUD. The patient refused transabdominal imaging due to cost.

EXAM:
ULTRASOUND PELVIS TRANSVAGINAL
TECHNIQUE: Transvaginal ultrasound examination of the pelvis was performed
including evaluation of the uterus, ovaries, adnexal regions, and
pelvic cul-de-sac.

[Series 1: us transvaginal non-ob · 0.13mm/px · 14 of 42 slices shown]
[im 1/42]
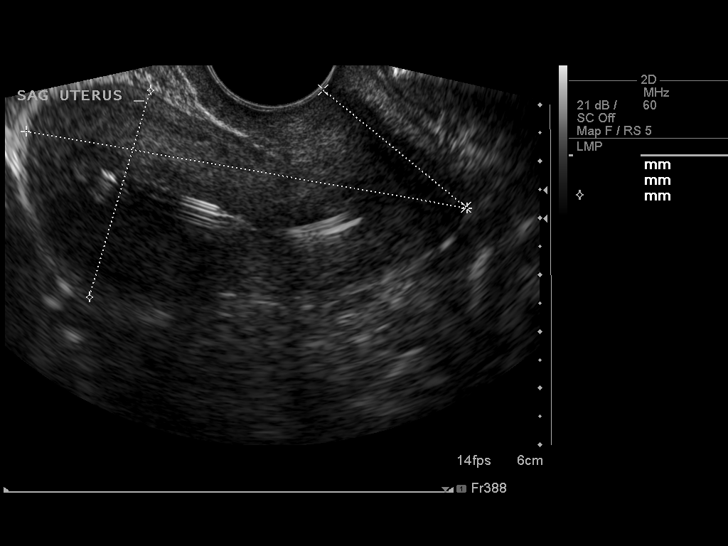
[im 4/42]
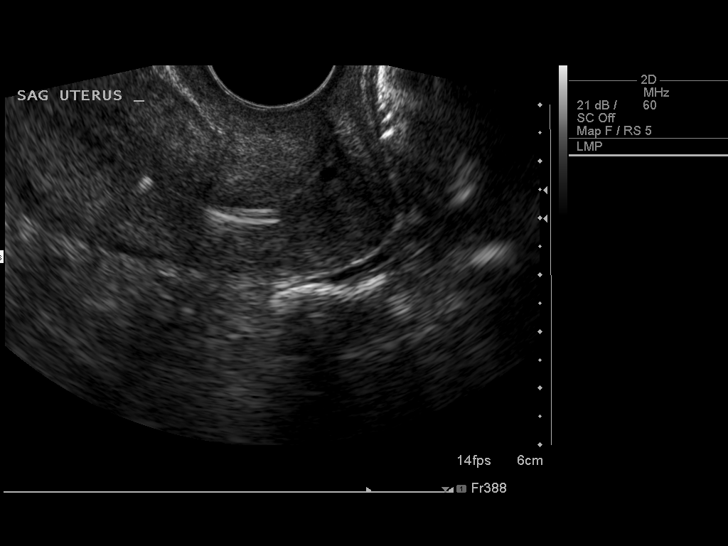
[im 7/42]
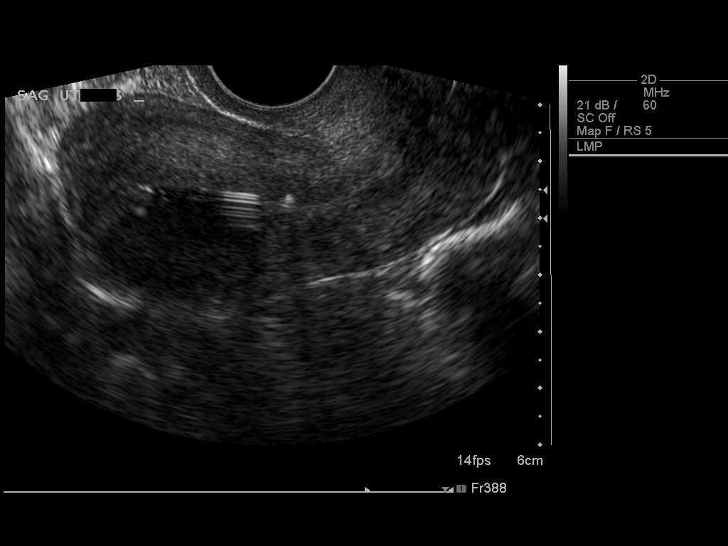
[im 11/42]
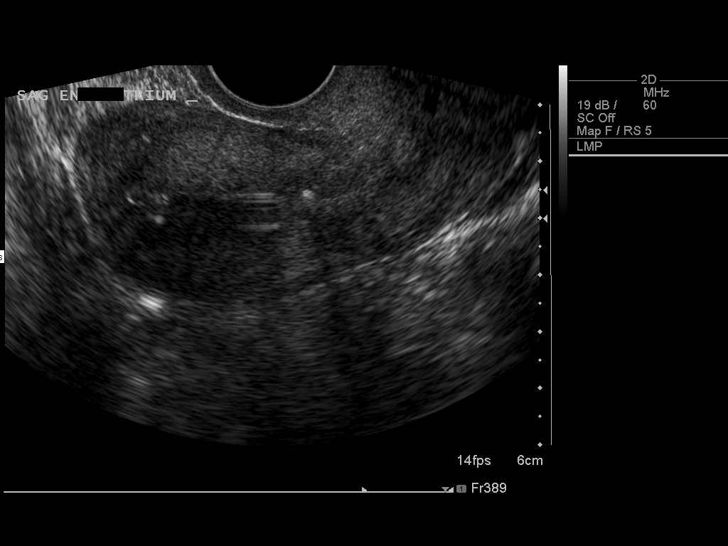
[im 14/42]
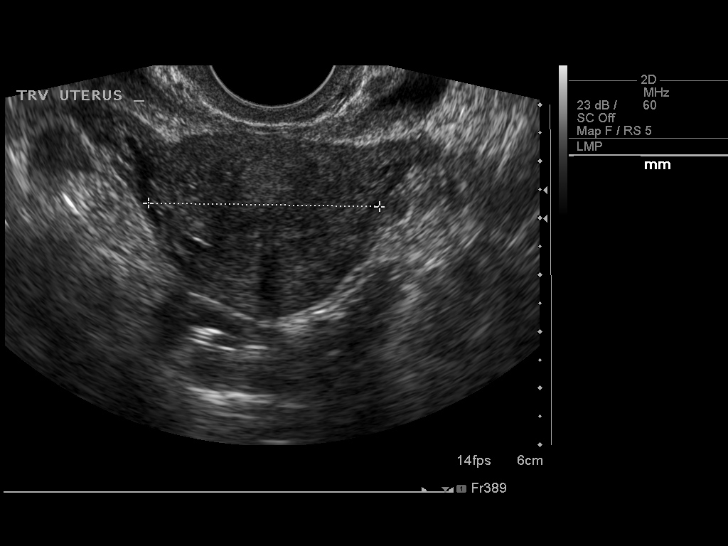
[im 16/42]
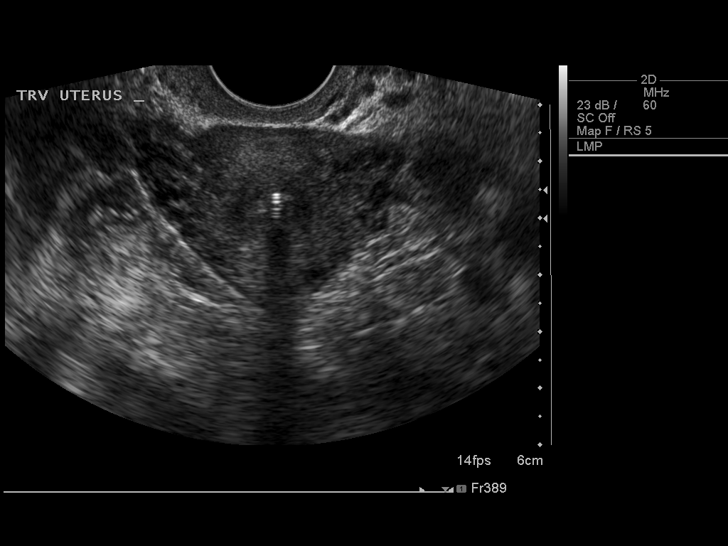
[im 19/42]
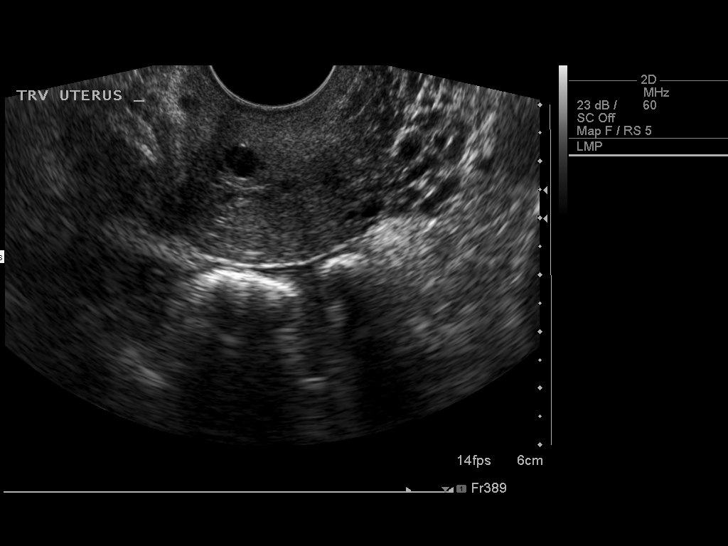
[im 23/42]
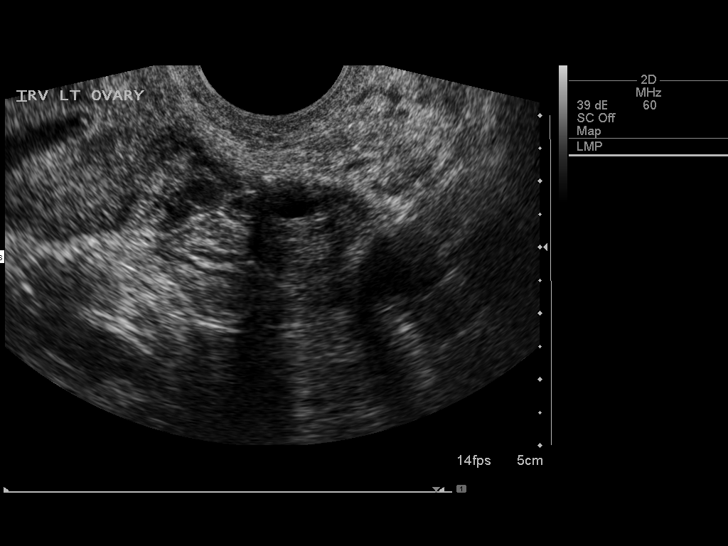
[im 26/42]
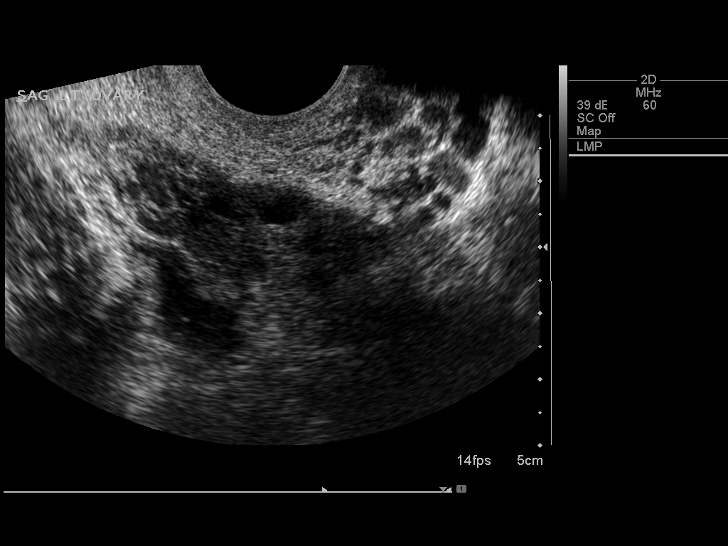
[im 28/42]
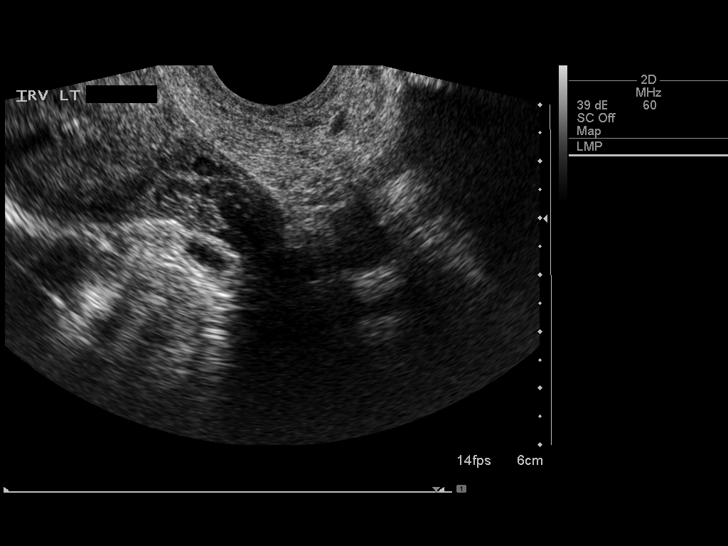
[im 31/42]
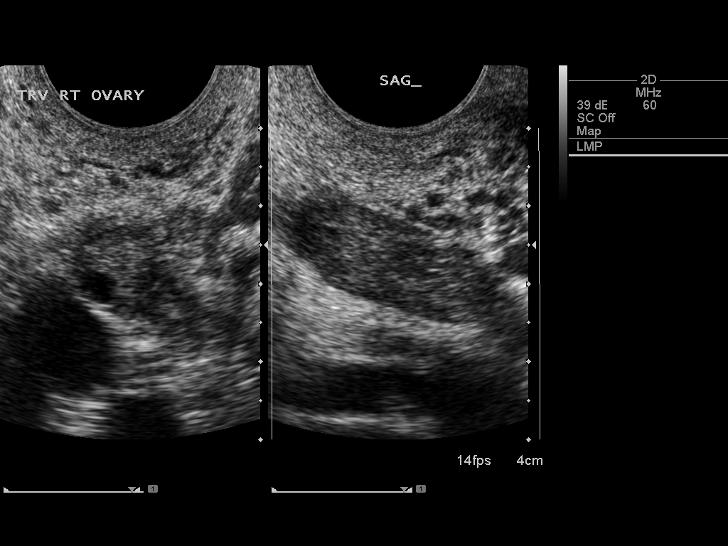
[im 35/42]
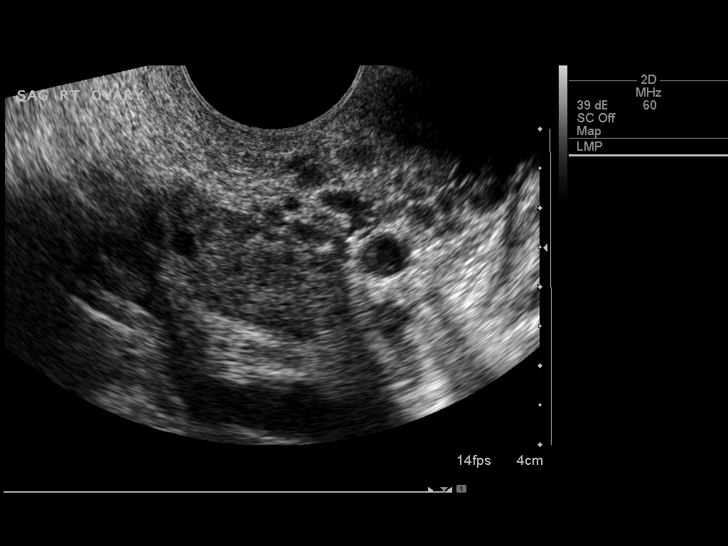
[im 38/42]
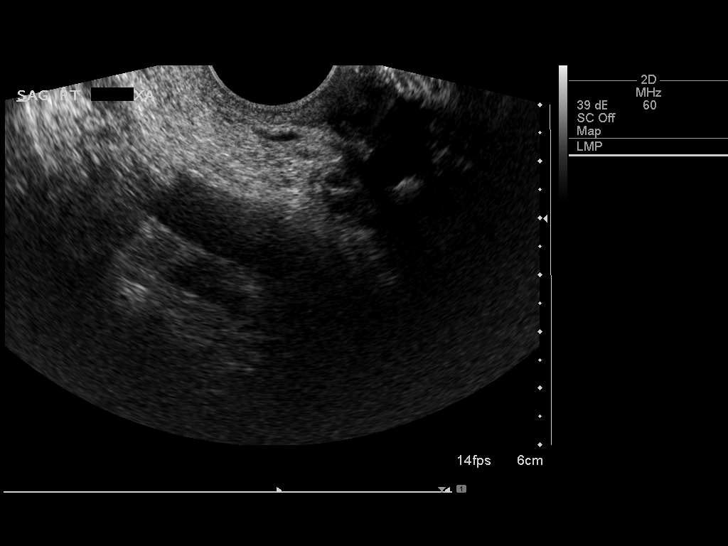
[im 42/42]
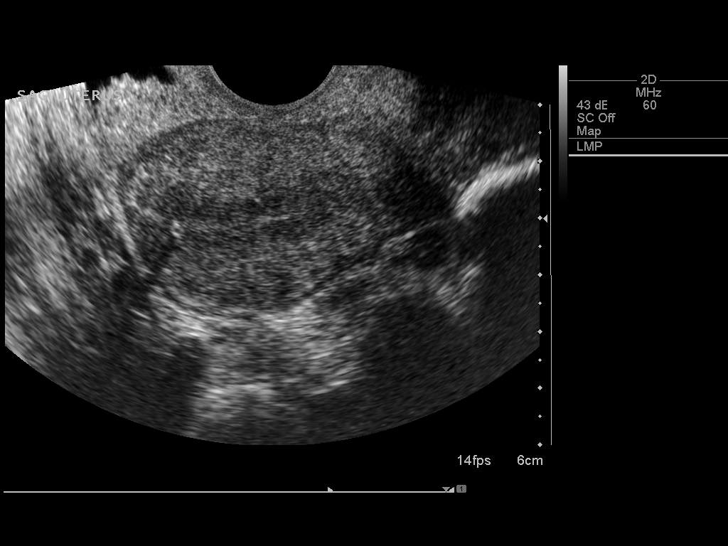

[14 of 25 positions shown; findings below may reference images not displayed]

FINDINGS: Uterus

Measurements: 11.2 x 3.8 x 4.1 cm. No fibroids or other mass
visualized.

Endometrium

Thickness: 4.3 mm. The Mirena IUD is present and appears
appropriately positioned. There is 2.5 mm echogenic shadowing focus
in the lower aspect of the endometrial cavity.

Right ovary

Measurements: 2.8 x 1.2 x 1.4 cm. Normal appearance/no adnexal mass.

Left ovary

Measurements: 3.3 x 2.2 x 2.1 cm. Normal appearance/no adnexal mass.

Other findings:  There is no free pelvic fluid.
IMPRESSION: 1. The Mirena IUD is in reasonable position ultrasonically.
2. 2.5 mm echogenic shadowing focus in the lower endometrial cavity
likely reflects a focal calcification or could be related to the
recovery string of the IUD.
3. Normal appearance of the ovaries and adnexal structures.
